# Patient Record
Sex: Female | Born: 1955
Health system: Southern US, Community
[De-identification: ages and names within clinical notes are randomized; demographics above are authoritative.]

## PROBLEM LIST (undated history)

## (undated) DIAGNOSIS — I10 Essential (primary) hypertension: Secondary | ICD-10-CM

## (undated) DIAGNOSIS — R112 Nausea with vomiting, unspecified: Secondary | ICD-10-CM

## (undated) DIAGNOSIS — E669 Obesity, unspecified: Secondary | ICD-10-CM

## (undated) DIAGNOSIS — I4891 Unspecified atrial fibrillation: Secondary | ICD-10-CM

## (undated) DIAGNOSIS — E785 Hyperlipidemia, unspecified: Secondary | ICD-10-CM

## (undated) DIAGNOSIS — Z9889 Other specified postprocedural states: Secondary | ICD-10-CM

## (undated) HISTORY — DX: Hyperlipidemia, unspecified: E78.5

## (undated) HISTORY — DX: Obesity, unspecified: E66.9

## (undated) HISTORY — DX: Essential (primary) hypertension: I10

## (undated) HISTORY — DX: Unspecified atrial fibrillation: I48.91

---

## 1995-02-19 HISTORY — PX: CHOLECYSTECTOMY: SHX55

## 1995-02-19 HISTORY — PX: TOTAL ABDOMINAL HYSTERECTOMY: SHX209

## 2000-02-01 ENCOUNTER — Encounter: Admission: RE | Admit: 2000-02-01 | Discharge: 2000-02-01 | Payer: Self-pay | Admitting: Obstetrics & Gynecology

## 2000-02-01 ENCOUNTER — Encounter: Payer: Self-pay | Admitting: Obstetrics & Gynecology

## 2000-02-09 ENCOUNTER — Other Ambulatory Visit: Admission: RE | Admit: 2000-02-09 | Discharge: 2000-02-09 | Payer: Self-pay | Admitting: Family Medicine

## 2001-05-12 ENCOUNTER — Other Ambulatory Visit: Admission: RE | Admit: 2001-05-12 | Discharge: 2001-05-12 | Payer: Self-pay | Admitting: Obstetrics & Gynecology

## 2001-09-13 ENCOUNTER — Encounter (INDEPENDENT_AMBULATORY_CARE_PROVIDER_SITE_OTHER): Payer: Self-pay | Admitting: Specialist

## 2001-09-13 ENCOUNTER — Ambulatory Visit (HOSPITAL_COMMUNITY): Admission: RE | Admit: 2001-09-13 | Discharge: 2001-09-13 | Payer: Self-pay | Admitting: Gastroenterology

## 2002-06-06 ENCOUNTER — Other Ambulatory Visit: Admission: RE | Admit: 2002-06-06 | Discharge: 2002-06-06 | Payer: Self-pay | Admitting: Obstetrics & Gynecology

## 2003-06-21 ENCOUNTER — Other Ambulatory Visit: Admission: RE | Admit: 2003-06-21 | Discharge: 2003-06-21 | Payer: Self-pay | Admitting: Obstetrics & Gynecology

## 2004-07-07 ENCOUNTER — Other Ambulatory Visit: Admission: RE | Admit: 2004-07-07 | Discharge: 2004-07-07 | Payer: Self-pay | Admitting: Obstetrics & Gynecology

## 2004-09-25 ENCOUNTER — Encounter: Admission: RE | Admit: 2004-09-25 | Discharge: 2004-09-25 | Payer: Self-pay | Admitting: Emergency Medicine

## 2007-08-03 ENCOUNTER — Encounter: Admission: RE | Admit: 2007-08-03 | Discharge: 2007-08-03 | Payer: Self-pay | Admitting: Cardiology

## 2007-08-15 ENCOUNTER — Ambulatory Visit (HOSPITAL_COMMUNITY): Admission: RE | Admit: 2007-08-15 | Discharge: 2007-08-15 | Payer: Self-pay | Admitting: Cardiology

## 2007-08-15 ENCOUNTER — Encounter (INDEPENDENT_AMBULATORY_CARE_PROVIDER_SITE_OTHER): Payer: Self-pay | Admitting: Cardiology

## 2007-12-04 ENCOUNTER — Inpatient Hospital Stay (HOSPITAL_COMMUNITY): Admission: AD | Admit: 2007-12-04 | Discharge: 2007-12-07 | Payer: Self-pay | Admitting: Cardiology

## 2008-08-02 ENCOUNTER — Encounter: Admission: RE | Admit: 2008-08-02 | Discharge: 2008-08-02 | Payer: Self-pay | Admitting: Emergency Medicine

## 2010-03-26 ENCOUNTER — Ambulatory Visit
Admission: RE | Admit: 2010-03-26 | Discharge: 2010-03-26 | Disposition: A | Discharge status: Home / Self Care | Payer: 59 | Source: Ambulatory Visit | Attending: Emergency Medicine | Admitting: Emergency Medicine

## 2010-03-26 ENCOUNTER — Other Ambulatory Visit: Payer: Self-pay | Admitting: Emergency Medicine

## 2010-03-26 DIAGNOSIS — M25569 Pain in unspecified knee: Secondary | ICD-10-CM

## 2010-04-29 ENCOUNTER — Ambulatory Visit (HOSPITAL_BASED_OUTPATIENT_CLINIC_OR_DEPARTMENT_OTHER): Payer: 59 | Attending: Cardiology

## 2010-04-29 DIAGNOSIS — I4892 Unspecified atrial flutter: Secondary | ICD-10-CM | POA: Insufficient documentation

## 2010-04-29 DIAGNOSIS — G4733 Obstructive sleep apnea (adult) (pediatric): Secondary | ICD-10-CM | POA: Insufficient documentation

## 2010-04-29 DIAGNOSIS — R0609 Other forms of dyspnea: Secondary | ICD-10-CM | POA: Insufficient documentation

## 2010-04-29 DIAGNOSIS — R259 Unspecified abnormal involuntary movements: Secondary | ICD-10-CM | POA: Insufficient documentation

## 2010-04-29 DIAGNOSIS — I4891 Unspecified atrial fibrillation: Secondary | ICD-10-CM | POA: Insufficient documentation

## 2010-04-29 DIAGNOSIS — I498 Other specified cardiac arrhythmias: Secondary | ICD-10-CM | POA: Insufficient documentation

## 2010-04-29 DIAGNOSIS — R0989 Other specified symptoms and signs involving the circulatory and respiratory systems: Secondary | ICD-10-CM | POA: Insufficient documentation

## 2010-05-03 DIAGNOSIS — R0989 Other specified symptoms and signs involving the circulatory and respiratory systems: Secondary | ICD-10-CM

## 2010-05-03 DIAGNOSIS — G4733 Obstructive sleep apnea (adult) (pediatric): Secondary | ICD-10-CM

## 2010-05-03 DIAGNOSIS — R259 Unspecified abnormal involuntary movements: Secondary | ICD-10-CM

## 2010-05-03 DIAGNOSIS — I4891 Unspecified atrial fibrillation: Secondary | ICD-10-CM

## 2010-05-03 DIAGNOSIS — R0609 Other forms of dyspnea: Secondary | ICD-10-CM

## 2010-05-03 NOTE — Procedures (Signed)
Christina Mason, Christina Mason                ACCOUNT NO.:  [id number]  MEDICAL RECORD NO.:  [id number]          PATIENT TYPE:  OUT  LOCATION:  SLEEP CENTER                 FACILITY:  [facility]  PHYSICIAN:  Clinton D. Maple Hudson, MD, FCCP, FACPDATE OF BIRTH:  October 25, 1955  DATE OF STUDY:  04/29/2010                           NOCTURNAL POLYSOMNOGRAM  REFERRING PHYSICIAN:  W SPENCER TILLEY JR  INDICATION FOR STUDY:  Hypersomnia with sleep apnea.  EPWORTH SLEEPINESS SCORE:  7/24.  BMI 36.6, weight 220 pounds, height 65 inches, neck 16 inches.  MEDICATIONS:  Home medications are charted and reviewed.  SLEEP ARCHITECTURE:  Total sleep time 218.5 minutes with sleep efficiency 57.9%.  Stage I was 26.8%, stage II 58.4%, stage III absent. REM 14.9% of total sleep time.  Sleep latency 37 minutes.  REM latency 283.5 minutes.  Awake after sleep onset 129.5 minutes.  Arousal index 37.9.  BEDTIME MEDICATIONS:  Vitamin D3, CoQ-10, metoprolol, Micardis, simvastatin, Pradaxa, flecainide.  RESPIRATORY DATA:  Apnea/hypopnea index (AHI) 31.9 per hour.  A total of 116 events was scored including 24 obstructive apneas, 8 central apneas, 1 mixed apnea, and 83 hypopneas.  Events were seen in all sleep positions, especially non-supine.  REM AHI 66.5 per hour, RDI 56 per hour.  Because she was unable to sustain sleep until nearly 3 a.m., she did not meet criteria permitting application of CPAP titration by split protocol on the study night.  OXYGEN DATA:  Very loud snoring with oxygen desaturation to a nadir of 76% and a mean oxygen saturation through the study of 91.7% on room air. During the total recording time including wake and sleep, a total of 42.8 minutes was recorded with oxygen saturation less than 90% and a total of 16.9 minutes was recorded with saturation less than 88% on room air.  CARDIAC DATA:  Most of the study was recorded with the patient in atrial fibrillation/flutter as a ventricular response rate of  70-90 per minute. At about 4 a.m., she converted to sinus bradycardia at the rate of 50-53 beats per minute.  MOVEMENT-PARASOMNIA:  Frequent limb jerks were recorded but could not be shown to affect sleep.  A total of 100 limb jerks were counted but none were associated with arousal or awakening.  IMPRESSIONS-RECOMMENDATIONS: 1. The study night was significant for difficulty initiating and     maintaining sleep.  Sleep was highly fragmented with sustained     intervals of wakefulness until about 3 a.m. when she finally     achieved sustained sleep including some REM.  Medications taken at     bedtime and noted above.  She did not take a sleep aid. 2. Moderately severe obstructive and central sleep apnea/hypopnea     syndrome, AHI 31.9 per hour with events particularly common while     sleeping nonsupine and in REM, RDI 56 per hour.  Very loud snoring     with oxygen desaturation to a nadir of 76% and a mean oxygen     saturation through the study of 91.7% on room air.  During the     total recording time, awake and asleep, a total of 42.8 minutes was  recorded with saturation less than 90% and a total of 16.9 minutes     recorded with saturation less than 88% on room air.  She had     arrived with room air saturation while awake of 95% on room air. 3. Because she was unable to sustain sleep, onset of events was     delayed.  She did not meet the criteria for initiation of the split     protocol CPAP titration for this study.  Consider return for a     dedicated CPAP titration study, with use of a sleep medication     suggested for consolidation and adequate recording. 4. Note that for most of the study, she was in atrial     fibrillation/flutter as noted above, converting to sinus     bradycardia at about 4 a.m.     Clinton D. Maple Hudson, MD, [facility], FACP Diplomate, Biomedical engineer of Sleep Medicine Electronically Signed    CDY/MEDQ  D:  05/03/2010 14:07:07  T:  05/03/2010  22:56:09  Job:  [id number]

## 2010-05-20 ENCOUNTER — Other Ambulatory Visit: Payer: Self-pay | Admitting: Emergency Medicine

## 2010-05-20 ENCOUNTER — Ambulatory Visit
Admission: RE | Admit: 2010-05-20 | Discharge: 2010-05-20 | Disposition: A | Discharge status: Home / Self Care | Payer: 59 | Source: Ambulatory Visit | Attending: Emergency Medicine | Admitting: Emergency Medicine

## 2010-05-20 DIAGNOSIS — J069 Acute upper respiratory infection, unspecified: Secondary | ICD-10-CM

## 2010-06-02 NOTE — Op Note (Signed)
Christina Mason, Christina Mason                ACCOUNT NO.:  [id number]   MEDICAL RECORD NO.:  [id number]          PATIENT TYPE:  INP   LOCATION:  2027                         FACILITY:  [facility]   PHYSICIAN:  Georga Hacking, M.D.DATE OF BIRTH:  1955/03/31   DATE OF PROCEDURE:  12/06/2007  DATE OF DISCHARGE:                               OPERATIVE REPORT   INDICATIONS:  Symptomatic atrial fibrillation.   PROCEDURE:  Elective cardioversion.   PROCEDURE IN DETAIL:  The patient was n.p.o. after midnight.  Dr. Krista Blue  administered 100 mg of propofol intravenously.  Cardioversion was done  using AP patches with 100 watts resulting in reversion to sinus rhythm  after a single shock.  Following cardioversion, her blood pressure was  around 80/60 and she was given intravenous fluids.  She was asymptomatic  at the end of the procedure.   IMPRESSION:  Successful cardioversion of atrial fibrillation.      Georga Hacking, M.D.  Electronically Signed     WST/MEDQ  D:  12/06/2007  T:  12/06/2007  Job:  [id number]   cc:   Reuben Likes, M.D.

## 2010-06-02 NOTE — Discharge Summary (Signed)
Christina Mason, Christina Mason                ACCOUNT NO.:  [id number]   MEDICAL RECORD NO.:  [id number]          PATIENT TYPE:  INP   LOCATION:  2027                         FACILITY:  [facility]   PHYSICIAN:  Georga Hacking, M.D.DATE OF BIRTH:  02-May-1955   DATE OF ADMISSION:  12/04/2007  DATE OF DISCHARGE:  12/07/2007                               DISCHARGE SUMMARY   FINAL DIAGNOSES:  1. Atrial fibrillation of undetermined age of onset successfully      converted to sinus rhythm.      a.     Placed on Multaq this admission.      b.     Successful cardioversion.  2. Hypertensive heart disease.  3. Obesity.  4. Long-term use of anticoagulants.   PROCEDURES:  Cardioversion.   HISTORY:  This is a 55 year old female who was admitted to initiate  dronedarone under monitoring for atrial fibrillation.  She has  previously been in good health with borderline blood pressure for  several years.  She was found to have an irregular heart rate as well as  high blood pressure noted when she went to donate blood.  In Dr.  Melanee Spry office, she was found to be hypertensive, was started on  hydrochlorothiazide and found to have hyperlipidemia.  In addition, she  has a new finding of atrial fibrillation of undetermined age of onset.  She has noticed fatigue and mild dyspnea with exertion over the past  year with a weight gain of 10 pounds over the past 2 years and was  modestly obese.  She was initially seen in July and placed on beta-  blockers which has been titrated up to 100 mg twice a day.  She was  somewhat refractory to becoming therapeutic on warfarin, but she has  been therapeutic now for 6 weeks.  She has had a negative treadmill test  previously and an echocardiogram showed mild left ventricular  hypertrophy.  Previous thyroid tests were normal.  Please see the  previously dictated history and physical for the remainder of the  details.   HOSPITAL COURSE:  The patient was admitted to the  hospital and began on  Multaq 400 mg twice a day.  She remained in atrial fibrillation and  tolerated the Multaq well.  Her warfarin was looked at and her protime  began to elevate and we had to reduce her dose of warfarin somewhat as a  result.  Her laboratory work showed normal CBC, therapeutic INR, and  normal chemistry panel.  EKG showed atrial fibrillation.   On December 06, 2007, she underwent cardioversion with 100 watts  resulting a reverse in a sinus rhythm and tolerated this well.  She had  some mild hypotension following cardioversion resolved with IV fluids  and was stable following this.  Her metoprolol dose was reduced to 50 mg  twice a day.  She had a INR of 3.6 on the day of discharge, was  instructed to hold her warfarin again and on the day of discharge, is to  begin 5 mg on Friday and Saturday with 10 mg on Sunday and  have  followup protime on Monday.   DISCHARGE MEDICATIONS:  1. Warfarin none today, 5 mg on Friday, 5 mg on Saturday, and 10 mg on      Sunday.  She is to have her repeat protime on Monday prior to      further dosing.  2. Vivelle patch 0.1 mg for 24 hours twice weekly.  3. Hydrochlorothiazide 12.5 mg daily.  4. Metoprolol extended release 50 mg twice a day.  5. Prilosec OTC 20 mg daily.  6. Vitamin D 50,000 units international daily.  7. Micardis 80 mg daily.  8. Multaq 400 mg twice daily with food.   DISCHARGE INSTRUCTIONS:  She is instructed to call the office for an  appointment for protime Monday and is to be seen in the office in 1 week  for an office visit and protime.      Georga Hacking, M.D.  Electronically Signed     WST/MEDQ  D:  12/07/2007  T:  12/07/2007  Job:  [id number]   cc:   Reuben Likes, M.D.

## 2010-06-05 NOTE — Op Note (Signed)
   NAME:  Christina Mason, Christina Mason                          ACCOUNT NO.:  [id number]   MEDICAL RECORD NO.:  [id number]                   PATIENT TYPE:  AMB   LOCATION:  ENDO                                 FACILITY:  [facility]   PHYSICIAN:  Charna Elizabeth, M.D.                   DATE OF BIRTH:  09/10/1955   DATE OF PROCEDURE:  09/13/2001  DATE OF DISCHARGE:                                 OPERATIVE REPORT   PROCEDURE:  Colonoscopy.   ENDOSCOPIST:  Charna Elizabeth, M.D.   INSTRUMENT USED:  Olympus video colonoscope.   INDICATIONS FOR PROCEDURE:  Iron deficiency anemia in a 55 year old white  female, rule out colonic polyps, mass and hemorrhoids.   PREPROCEDURE PREPARATION:  Informed consent was procured from the patient.  The patient fasted for eight hours prior to the procedure and prepped with a  bottle of magnesium citrate and a gallon of NuLytely the night prior to the  procedure.   PREPROCEDURE PHYSICAL:  VITAL SIGNS:  The patient had stable vital signs.  NECK:  Neck supple.  LUNGS:  Chest clear to auscultation.  CARDIAC:  S1 and S2, regular.  ABDOMEN:  Abdomen soft with normal bowel sounds.   DESCRIPTION OF PROCEDURE:  The patient was placed in the left lateral  decubitus position, sedated with an additional 2 mg of Versed intravenously.  Once the patient was adequately sedated and had been maintained on low flow  oxygen and continuous cardiac monitoring, the Olympus video colonoscope was  ran from the rectum to the cecum and terminal ileum without difficulty. The  entire exam was normal. No masses, polyps, lesions, ulcerations, or  diverticula were seen.   IMPRESSION:  Normal colonoscopy up to the terminal ileum.   RECOMMENDATIONS:  1. Check a celiac panel.  2. Outpatient follow-up in the next two weeks for further recommendations.                                               Charna Elizabeth, M.D.    JM/MEDQ  D:  09/13/2001  T:  09/17/2001  Job:  [id number]   cc:   Reuben Likes,  M.D.

## 2010-06-05 NOTE — Op Note (Signed)
   NAME:  Christina Mason, Christina Mason                          ACCOUNT NO.:  [id number]   MEDICAL RECORD NO.:  [id number]                   PATIENT TYPE:  AMB   LOCATION:  ENDO                                 FACILITY:  [facility]   PHYSICIAN:  Charna Elizabeth, M.D.                   DATE OF BIRTH:  18-Nov-1955   DATE OF PROCEDURE:  09/13/2001  DATE OF DISCHARGE:                                 OPERATIVE REPORT   PROCEDURE:  Esophagogastroduodenoscopy with multiple biopsies.   ENDOSCOPIST:  Charna Elizabeth, M.D.   INSTRUMENT USED:  Olympus video panendoscope.   INDICATIONS:  A 55 year old white female with a history of iron-deficiency  anemia.  Rule out peptic ulcer disease, esophagitis, gastritis, etc.   PREPROCEDURE PREPARATION:  Informed consent was procured from the patient.  The patient was fasted for eight hours prior to the procedure.   PREPROCEDURE PHYSICAL:  VITAL SIGNS:  The patient had stable vital signs.  NECK:  Supple.  CHEST:  Clear to auscultation.  S1, S2 regular.  ABDOMEN:  Soft with normal bowel sounds.   DESCRIPTION OF PROCEDURE:  The patient was placed in the left lateral  decubitus position and sedated with 50 mg of Demerol and 5 mg of Versed  intravenously.  Once the patient was adequately sedate and maintained on low-  flow oxygen and continuous cardiac monitoring, the Olympus video  panendoscope was advanced through the mouthpiece, over the tongue, into the  esophagus under direct vision.  The proximal esophagus appeared normal.  There was mild distal esophagitis.  A 3-4 cm hiatal hernia was seen on high  retroflexion.  There were multiple small sessile polyps seen in the proximal  half of the stomach.  The rest of the gastric mucosa and main part in antrum  appeared normal.  The proximal small bowel appeared normal as well.   IMPRESSION:  1. Grade 1 distal esophagitis.  2. A 3-4 cm hiatal hernia.  3. Multiple sessile polyps from the proximal stomach, biopsied for  pathology.  4. Normal-appearing antrum and proximal small bowel.   RECOMMENDATIONS:  1. Await pathology results.  2. Avoid all nonsteroidals including aspirin for now.  3.     PPI of choice.  4. Proceed with a colonoscopy at this time.  5. Outpatient follow-up in the next two weeks.                                               Charna Elizabeth, M.D.    JM/MEDQ  D:  09/13/2001  T:  09/17/2001  Job:  [id number]   cc:   Reuben Likes, M.D.

## 2010-06-25 ENCOUNTER — Ambulatory Visit
Admission: RE | Admit: 2010-06-25 | Discharge: 2010-06-25 | Disposition: A | Discharge status: Home / Self Care | Payer: 59 | Source: Ambulatory Visit | Attending: Emergency Medicine | Admitting: Emergency Medicine

## 2010-06-25 ENCOUNTER — Other Ambulatory Visit: Payer: Self-pay | Admitting: Emergency Medicine

## 2010-06-25 DIAGNOSIS — R0602 Shortness of breath: Secondary | ICD-10-CM

## 2010-06-25 DIAGNOSIS — R05 Cough: Secondary | ICD-10-CM

## 2010-06-25 DIAGNOSIS — Z09 Encounter for follow-up examination after completed treatment for conditions other than malignant neoplasm: Secondary | ICD-10-CM

## 2010-10-07 ENCOUNTER — Institutional Professional Consult (permissible substitution): Payer: 59 | Admitting: Pulmonary Disease

## 2010-10-13 ENCOUNTER — Encounter: Payer: Self-pay | Admitting: Pulmonary Disease

## 2010-10-14 ENCOUNTER — Ambulatory Visit (INDEPENDENT_AMBULATORY_CARE_PROVIDER_SITE_OTHER): Payer: 59 | Admitting: Pulmonary Disease

## 2010-10-14 ENCOUNTER — Encounter: Payer: Self-pay | Admitting: Pulmonary Disease

## 2010-10-14 VITALS — BP 124/70 | HR 74 | Temp 98.3°F | Ht 64.5 in | Wt 225.2 lb

## 2010-10-14 DIAGNOSIS — G4733 Obstructive sleep apnea (adult) (pediatric): Secondary | ICD-10-CM

## 2010-10-14 HISTORY — DX: Obstructive sleep apnea (adult) (pediatric): G47.33

## 2010-10-14 NOTE — Progress Notes (Signed)
  Subjective:    Patient ID: Christina Mason, female    DOB: 1955/02/28, 55 y.o.   MRN: [id number]  HPI The patient is a 55 year old female who I've been asked to see for management of obstructive sleep apnea.  She was diagnosed in April of this year with moderate OSA, with AHI of 32 events per hour.  The patient currently has been noted to have loud snoring, but no one has mentioned an abnormal breathing pattern during sleep.  She has moderate numbers of awakenings at night, and only feels rested in the a.m. about 50% of the time.  She notes definite sleep pressure during the day with periods of inactivity, and can have sleepiness with meetings as well.  She has no issues with sleepiness in the evening, and feels that she gets her "second wind".  She has some sleep pressure with driving longer distances.  The patient notes that her weight is up about 18 pounds over the last 2 years, and her Epworth score at the time of her sleep study was 7.  Sleep Questionnaire: What time do you typically go to bed?( Between what hours) 11:30 pm to 12:30 am How long does it take you to fall asleep? about 20 mins How many times during the night do you wake up? 3 What time do you get out of bed to start your day? 0715 Do you drive or operate heavy machinery in your occupation? No How much has your weight changed (up or down) over the past two years? (In pounds) 15 lb (6.804 kg) Have you ever had a sleep study before? Yes If yes, location of study? [facility] If yes, date of study? April 2012 Do you currently use CPAP? No Do you wear oxygen at any time? No     Review of Systems  Constitutional: Negative for fever and unexpected weight change.  HENT: Negative for ear pain, nosebleeds, congestion, sore throat, rhinorrhea, sneezing, trouble swallowing, dental problem, postnasal drip and sinus pressure.   Eyes: Negative for redness and itching.  Respiratory: Positive for shortness of breath. Negative for cough, chest tightness and  wheezing.   Cardiovascular: Positive for palpitations and leg swelling.  Gastrointestinal: Negative for nausea and vomiting.  Genitourinary: Negative for dysuria.  Musculoskeletal: Positive for joint swelling.  Skin: Negative for rash.  Neurological: Negative for headaches.  Hematological: Does not bruise/bleed easily.  Psychiatric/Behavioral: Negative for dysphoric mood. The patient is not nervous/anxious.        Objective:   Physical Exam Constitutional: obese female, no acute distress  HENT:  Nares patent without discharge  Oropharynx without exudate, palate and uvula are moderately elongated  Eyes:  Perrla, eomi, no scleral icterus  Neck:  No JVD, no TMG  Cardiovascular:  Irregular rhythm but CVR, no rubs or gallops.  No murmurs        Intact distal pulses  Pulmonary :  Normal breath sounds, no stridor or respiratory distress   No rales, rhonchi, or wheezing  Abdominal:  Soft, nondistended, bowel sounds present.  No tenderness noted.   Musculoskeletal:  1+ lower extremity edema noted.  Lymph Nodes:  No cervical lymphadenopathy noted  Skin:  No cyanosis noted  Neurologic:  Alert, appropriate, moves all 4 extremities without obvious deficit.         Assessment & Plan:

## 2010-10-14 NOTE — Patient Instructions (Signed)
Will set up on cpap.  Please call if having tolerance issues. Work on weight loss.  followup with me in 5 weeks.

## 2010-10-14 NOTE — Assessment & Plan Note (Signed)
The patient has moderate obstructive sleep apnea by her recent sleep study, and definitely has an impact on her sleep and quality of life.  I've explained to her that it also may impact her cardiovascular health, especially in light of her atrial fibrillation.  I have reviewed the various treatment options for sleep apnea, but have recommended CPAP while working on weight reduction.  She is willing to give this a try. I will set the patient up on cpap at a moderate pressure level to allow for desensitization, and will troubleshoot the device over the next 4-6weeks if needed.  The pt is to call me if having issues with tolerance.  Will then optimize the pressure once patient is able to wear cpap on a consistent basis.

## 2010-10-19 ENCOUNTER — Encounter: Payer: Self-pay | Admitting: Internal Medicine

## 2010-10-20 LAB — COMPREHENSIVE METABOLIC PANEL
AST: 28
Albumin: 3.7
Alkaline Phosphatase: 72
BUN: 18
CO2: 27
Chloride: 106
GFR calc Af Amer: >60
Potassium: 4
Total Bilirubin: 0.6

## 2010-10-20 LAB — DIFFERENTIAL
Basophils Absolute: 0
Basophils Relative: 1
Eosinophils Relative: 2
Monocytes Absolute: 0.5

## 2010-10-20 LAB — PROTIME-INR
INR: 3.6 — ABNORMAL HIGH
INR: 3.6 — ABNORMAL HIGH
Prothrombin Time: 38.7 — ABNORMAL HIGH

## 2010-10-20 LAB — CBC
HCT: 44.9
Platelets: 256
RBC: 4.78
WBC: 7.5

## 2011-05-06 ENCOUNTER — Other Ambulatory Visit: Payer: Self-pay | Admitting: Dermatology

## 2011-05-06 DIAGNOSIS — G629 Polyneuropathy, unspecified: Secondary | ICD-10-CM | POA: Insufficient documentation

## 2011-05-06 DIAGNOSIS — N959 Unspecified menopausal and perimenopausal disorder: Secondary | ICD-10-CM

## 2011-05-06 DIAGNOSIS — I10 Essential (primary) hypertension: Secondary | ICD-10-CM

## 2011-05-06 HISTORY — DX: Unspecified menopausal and perimenopausal disorder: N95.9

## 2011-05-06 HISTORY — DX: Essential (primary) hypertension: I10

## 2011-06-05 ENCOUNTER — Other Ambulatory Visit: Payer: Self-pay | Admitting: Cardiology

## 2013-06-08 ENCOUNTER — Ambulatory Visit (INDEPENDENT_AMBULATORY_CARE_PROVIDER_SITE_OTHER): Payer: 59 | Admitting: General Surgery

## 2013-06-08 ENCOUNTER — Encounter (INDEPENDENT_AMBULATORY_CARE_PROVIDER_SITE_OTHER): Payer: Self-pay | Admitting: General Surgery

## 2013-06-08 VITALS — BP 128/80 | HR 103 | Temp 97.7°F | Ht 64.0 in | Wt 227.8 lb

## 2013-06-08 DIAGNOSIS — D171 Benign lipomatous neoplasm of skin and subcutaneous tissue of trunk: Secondary | ICD-10-CM

## 2013-06-08 DIAGNOSIS — D1779 Benign lipomatous neoplasm of other sites: Secondary | ICD-10-CM

## 2013-06-08 NOTE — Progress Notes (Signed)
Patient ID: Christina Mason, female   DOB: 14-Dec-1955, 58 y.o.   MRN: [id number]  Chief Complaint  Patient presents with  . lipoma on back    HPI Christina Mason is a 58 y.o. female.  Referred by Roe Coombs, PA HPI This is a 58 year old female who for several years it has a mass on her back. This is not really bother her much at all. It is a little bit tender to the touch when she pulls on it. She's otherwise had no problems with this. Not really any period of rapid growth either. She was seen  by her primary care physician and physician's assistant. This was thought to be a lipoma and it appeared that was the case. She is referred for evaluation.  Past Medical History  Diagnosis Date  . Atrial fibrillation   . Hypertension   . Hyperlipidemia   . Obesity     Past Surgical History  Procedure Laterality Date  . Cholecystectomy  feb 1997  . Total abdominal hysterectomy  feb 1997    Family History  Problem Relation Age of Onset  . Asthma Mother   . Heart disease Mother   . Rheum arthritis Mother     Social History History  Substance Use Topics  . Smoking status: Never Smoker   . Smokeless tobacco: Not on file  . Alcohol Use: Yes    Allergies  Allergen Reactions  . Lisinopril     cough    Current Outpatient Prescriptions  Medication Sig Dispense Refill  . Cholecalciferol (VITAMIN D3) 2000 UNITS TABS Take 1 tablet by mouth daily.        . Coenzyme Q10 (COQ10) 200 MG CAPS Take 1 capsule by mouth daily.        . dabigatran (PRADAXA) 150 MG CAPS Take 150 mg by mouth 2 (two) times daily.        Marland Kitchen estradiol (VIVELLE-DOT) 0.1 MG/24HR Place 1 patch onto the skin as directed.       . fish oil-omega-3 fatty acids 1000 MG capsule Take 1 capsule by mouth daily.        . flecainide (TAMBOCOR) 100 MG tablet Take 100 mg by mouth 2 (two) times daily.        . hydrochlorothiazide (MICROZIDE) 12.5 MG capsule Take 25 mg by mouth daily.        . metoprolol (TOPROL-XL) 100 MG 24 hr  tablet Take 50 mg by mouth 2 (two) times daily.        . Multiple Vitamin (MULTIVITAMIN) capsule Take 1 capsule by mouth daily.        Marland Kitchen omeprazole (PRILOSEC) 20 MG capsule Take 20 mg by mouth daily.        . simvastatin (ZOCOR) 20 MG tablet Take 20 mg by mouth daily.        Marland Kitchen telmisartan (MICARDIS) 80 MG tablet Take 80 mg by mouth daily.         No current facility-administered medications for this visit.    Review of Systems Review of Systems  Constitutional: Negative for fever, chills and unexpected weight change.  HENT: Negative for congestion, hearing loss, sore throat, trouble swallowing and voice change.   Eyes: Negative for visual disturbance.  Respiratory: Negative for cough and wheezing.   Cardiovascular: Negative for chest pain, palpitations and leg swelling.  Gastrointestinal: Negative for nausea, vomiting, abdominal pain, diarrhea, constipation, blood in stool, abdominal distention and anal bleeding.  Genitourinary: Negative for hematuria, vaginal bleeding and difficulty  urinating.  Musculoskeletal: Positive for arthralgias.  Skin: Negative for rash and wound.  Neurological: Negative for seizures, syncope and headaches.  Hematological: Negative for adenopathy. Does not bruise/bleed easily.  Psychiatric/Behavioral: Negative for confusion.    Blood pressure 128/80, pulse 103, temperature 97.7 F (36.5 C), height 5\' 4"  (1.626 m), weight 227 lb 12.8 oz (103.329 kg).  Physical Exam Physical Exam  Vitals reviewed. Constitutional: She appears well-developed and well-nourished.  Pulmonary/Chest:        Assessment    Back lipoma    Plan    I do think this is a lipoma. I told her that I do not think it necessarily need to be excised. We discussed indications for excision including a change in her exam, period of rapid growth, or symptoms that she would like to have it removed. She is comfortable watching this and I will plan on seeing her back as needed.         Rolm Bookbinder 06/08/2013, 10:08 AM

## 2013-06-08 NOTE — Patient Instructions (Signed)

## 2013-11-08 DIAGNOSIS — K449 Diaphragmatic hernia without obstruction or gangrene: Secondary | ICD-10-CM | POA: Insufficient documentation

## 2013-11-08 HISTORY — DX: Diaphragmatic hernia without obstruction or gangrene: K44.9

## 2014-08-01 ENCOUNTER — Other Ambulatory Visit: Payer: Self-pay | Admitting: Obstetrics & Gynecology

## 2014-08-02 LAB — CYTOLOGY - PAP

## 2015-03-13 ENCOUNTER — Ambulatory Visit (HOSPITAL_COMMUNITY)
Admission: RE | Admit: 2015-03-13 | Discharge: 2015-03-13 | Disposition: A | Discharge status: Home / Self Care | Payer: 59 | Source: Ambulatory Visit | Attending: Vascular Surgery | Admitting: Vascular Surgery

## 2015-03-13 ENCOUNTER — Other Ambulatory Visit: Payer: Self-pay | Admitting: Cardiology

## 2015-03-13 DIAGNOSIS — I10 Essential (primary) hypertension: Secondary | ICD-10-CM | POA: Insufficient documentation

## 2015-03-13 DIAGNOSIS — I872 Venous insufficiency (chronic) (peripheral): Secondary | ICD-10-CM

## 2015-03-13 DIAGNOSIS — I4891 Unspecified atrial fibrillation: Secondary | ICD-10-CM | POA: Diagnosis not present

## 2015-03-13 DIAGNOSIS — E785 Hyperlipidemia, unspecified: Secondary | ICD-10-CM | POA: Diagnosis not present

## 2015-03-13 DIAGNOSIS — E669 Obesity, unspecified: Secondary | ICD-10-CM | POA: Diagnosis not present

## 2017-07-19 DIAGNOSIS — G4733 Obstructive sleep apnea (adult) (pediatric): Secondary | ICD-10-CM | POA: Diagnosis not present

## 2017-10-17 DIAGNOSIS — L821 Other seborrheic keratosis: Secondary | ICD-10-CM | POA: Diagnosis not present

## 2017-10-17 DIAGNOSIS — L918 Other hypertrophic disorders of the skin: Secondary | ICD-10-CM | POA: Diagnosis not present

## 2017-10-19 DIAGNOSIS — H2513 Age-related nuclear cataract, bilateral: Secondary | ICD-10-CM | POA: Diagnosis not present

## 2017-11-09 DIAGNOSIS — G6289 Other specified polyneuropathies: Secondary | ICD-10-CM | POA: Diagnosis not present

## 2017-12-28 DIAGNOSIS — Z01419 Encounter for gynecological examination (general) (routine) without abnormal findings: Secondary | ICD-10-CM | POA: Diagnosis not present

## 2017-12-28 DIAGNOSIS — Z6839 Body mass index (BMI) 39.0-39.9, adult: Secondary | ICD-10-CM | POA: Diagnosis not present

## 2017-12-28 DIAGNOSIS — Z1231 Encounter for screening mammogram for malignant neoplasm of breast: Secondary | ICD-10-CM | POA: Diagnosis not present

## 2018-03-08 DIAGNOSIS — G4733 Obstructive sleep apnea (adult) (pediatric): Secondary | ICD-10-CM | POA: Diagnosis not present

## 2018-03-20 DIAGNOSIS — R202 Paresthesia of skin: Secondary | ICD-10-CM | POA: Diagnosis not present

## 2018-03-28 DIAGNOSIS — D0462 Carcinoma in situ of skin of left upper limb, including shoulder: Secondary | ICD-10-CM | POA: Diagnosis not present

## 2018-03-30 DIAGNOSIS — I10 Essential (primary) hypertension: Secondary | ICD-10-CM | POA: Diagnosis not present

## 2018-03-30 DIAGNOSIS — Z0000 Encounter for general adult medical examination without abnormal findings: Secondary | ICD-10-CM | POA: Diagnosis not present

## 2018-03-30 DIAGNOSIS — I4891 Unspecified atrial fibrillation: Secondary | ICD-10-CM | POA: Diagnosis not present

## 2018-03-30 DIAGNOSIS — E782 Mixed hyperlipidemia: Secondary | ICD-10-CM | POA: Diagnosis not present

## 2018-03-30 HISTORY — DX: Morbid (severe) obesity due to excess calories: E66.01

## 2018-04-05 DIAGNOSIS — C4492 Squamous cell carcinoma of skin, unspecified: Secondary | ICD-10-CM

## 2018-04-05 HISTORY — DX: Squamous cell carcinoma of skin, unspecified: C44.92

## 2018-04-13 ENCOUNTER — Telehealth: Payer: Self-pay | Admitting: Emergency Medicine

## 2018-04-13 NOTE — Telephone Encounter (Signed)
Left message for patient to return call regarding switching visit to a televisit.  

## 2018-04-14 NOTE — Telephone Encounter (Signed)
Patient informed of visit on Monday will be a video visit. Patient understands and agrees to this.

## 2018-04-17 ENCOUNTER — Other Ambulatory Visit: Payer: Self-pay

## 2018-04-17 ENCOUNTER — Telehealth: Payer: Self-pay | Admitting: Emergency Medicine

## 2018-04-17 ENCOUNTER — Telehealth (INDEPENDENT_AMBULATORY_CARE_PROVIDER_SITE_OTHER): Payer: BLUE CROSS/BLUE SHIELD | Admitting: Cardiology

## 2018-04-17 DIAGNOSIS — I10 Essential (primary) hypertension: Secondary | ICD-10-CM | POA: Insufficient documentation

## 2018-04-17 DIAGNOSIS — E785 Hyperlipidemia, unspecified: Secondary | ICD-10-CM

## 2018-04-17 DIAGNOSIS — Z7901 Long term (current) use of anticoagulants: Secondary | ICD-10-CM

## 2018-04-17 DIAGNOSIS — I4821 Permanent atrial fibrillation: Secondary | ICD-10-CM

## 2018-04-17 DIAGNOSIS — I4891 Unspecified atrial fibrillation: Secondary | ICD-10-CM | POA: Diagnosis not present

## 2018-04-17 DIAGNOSIS — G4733 Obstructive sleep apnea (adult) (pediatric): Secondary | ICD-10-CM | POA: Diagnosis not present

## 2018-04-17 HISTORY — DX: Long term (current) use of anticoagulants: Z79.01

## 2018-04-17 HISTORY — DX: Hyperlipidemia, unspecified: E78.5

## 2018-04-17 NOTE — Patient Instructions (Addendum)
Medication Instructions:  Your physician recommends that you continue on your current medications as directed. Please refer to the Current Medication list given to you today.cur  If you need a refill on your cardiac medications before your next appointment, please call your pharmacy.   Lab work: None.  If you have labs (blood work) drawn today and your tests are completely normal, you will receive your results only by: Marland Kitchen MyChart Message (if you have MyChart) OR . A paper copy in the mail If you have any lab test that is abnormal or we need to change your treatment, we will call you to review the results.  Testing/Procedures: Your physician has requested that you have an echocardiogram. Echocardiography is a painless test that uses sound waves to create images of your heart. It provides your doctor with information about the size and shape of your heart and how well your heart's chambers and valves are working. This procedure takes approximately one hour. There are no restrictions for this procedure.    Follow-Up: At [facility], you and your health needs are our priority.  As part of our continuing mission to provide you with exceptional heart care, we have created designated Provider Care Teams.  These Care Teams include your primary Cardiologist (physician) and Advanced Practice Providers (APPs -  Physician Assistants and Nurse Practitioners) who all work together to provide you with the care you need, when you need it. You will need a follow up appointment in 5 months.  Please call our office 2 months in advance to schedule this appointment.  You may see No primary care provider on file. or another member of our [employer] Provider Team in [place]: Shirlee More, MD . Jyl Heinz, MD  Any Other Special Instructions Will Be Listed Below (If Applicable).     Echocardiogram An echocardiogram is a procedure that uses painless sound waves (ultrasound) to produce an image of  the heart. Images from an echocardiogram can provide important information about:  Signs of coronary artery disease (CAD).  Aneurysm detection. An aneurysm is a weak or damaged part of an artery wall that bulges out from the normal force of blood pumping through the body.  Heart size and shape. Changes in the size or shape of the heart can be associated with certain conditions, including heart failure, aneurysm, and CAD.  Heart muscle function.  Heart valve function.  Signs of a past heart attack.  Fluid buildup around the heart.  Thickening of the heart muscle.  A tumor or infectious growth around the heart valves. Tell a health care provider about:  Any allergies you have.  All medicines you are taking, including vitamins, herbs, eye drops, creams, and over-the-counter medicines.  Any blood disorders you have.  Any surgeries you have had.  Any medical conditions you have.  Whether you are pregnant or may be pregnant. What are the risks? Generally, this is a safe procedure. However, problems may occur, including:  Allergic reaction to dye (contrast) that may be used during the procedure. What happens before the procedure? No specific preparation is needed. You may eat and drink normally. What happens during the procedure?   An IV tube may be inserted into one of your veins.  You may receive contrast through this tube. A contrast is an injection that improves the quality of the pictures from your heart.  A gel will be applied to your chest.  A wand-like tool (transducer) will be moved over your chest. The gel  will help to transmit the sound waves from the transducer.  The sound waves will harmlessly bounce off of your heart to allow the heart images to be captured in real-time motion. The images will be recorded on a computer. The procedure may vary among health care providers and hospitals. What happens after the procedure?  You may return to your normal,  everyday life, including diet, activities, and medicines, unless your health care provider tells you not to do that. Summary  An echocardiogram is a procedure that uses painless sound waves (ultrasound) to produce an image of the heart.  Images from an echocardiogram can provide important information about the size and shape of your heart, heart muscle function, heart valve function, and fluid buildup around your heart.  You do not need to do anything to prepare before this procedure. You may eat and drink normally.  After the echocardiogram is completed, you may return to your normal, everyday life, unless your health care provider tells you not to do that. This information is not intended to replace advice given to you by your health care provider. Make sure you discuss any questions you have with your health care provider. Document Released: 01/02/2000 Document Revised: 02/07/2016 Document Reviewed: 02/07/2016 Elsevier Interactive Patient Education  2019 [employer].

## 2018-04-17 NOTE — Telephone Encounter (Signed)
Patient needs echocardiogram scheduled when able.  

## 2018-04-17 NOTE — Addendum Note (Signed)
Addended by: Ashok Norris on: 04/17/2018 04:22 PM   Modules accepted: Orders

## 2018-04-17 NOTE — Progress Notes (Signed)
Virtual Visit via Video Note    Evaluation Performed:  Follow-up visit  This visit type was conducted due to national recommendations for restrictions regarding the COVID-19 Pandemic (e.g. social distancing).  This format is felt to be most appropriate for this patient at this time.  All issues noted in this document were discussed and addressed.  No physical exam was performed (except for noted visual exam findings with Video Visits).  Please refer to the patient's chart (MyChart message for video visits and phone note for telephone visits) for the patient's consent to telehealth for [facility].  Date:  04/17/2018  ID: Christina HENRIE, DOB 1955/07/22, MRN [id number]   Patient Location:  [address] [place]  [address]   Provider location:   [place] [facility]  PCP:  Chesley Noon, MD  Cardiologist:  Jenne Campus, MD     Chief Complaint: I am doing well  History of Present Illness:    Christina Mason is a 63 y.o. female  who presents via audio/video conferencing for a telehealth visit today.  She is a patient of Dr. Thurman Coyer.  She does have past medical history significant for permanent atrial fibrillation, hypertension, dyslipidemia, chronic venous insufficiency, long-term anticoagulant use.  We talked over the dealing today overall she says she is doing well she described to have some exertional shortness of breath as usual.  Sometimes rarely she had some palpitations that she is aware of her heart beating somewhat irregular and fast.  She does have cardiac device but she was not sure exactly how to send the recording to [place] I gave her some instruction how to do it.  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  We discussed [place] [address] issue with all precautions that she need to take.   The patient does not symptoms concerning for COVID-19 infection (fever, chills, cough, or new SHORTNESS OF BREATH).    Prior CV studies:   The  following studies were reviewed today:       Past Medical History:  Diagnosis Date  . Atrial fibrillation ([place])   . Hyperlipidemia   . Hypertension   . Obesity     Past Surgical History:  Procedure Laterality Date  . CHOLECYSTECTOMY  feb 1997  . TOTAL ABDOMINAL HYSTERECTOMY  feb 1997     No outpatient medications have been marked as taking for the 04/17/18 encounter (Appointment) with Park Liter, MD.      Family History: The patient's family history includes Alcoholism in her father; Asthma in her mother; CAD in her mother; Dementia in her mother; Heart disease in her mother; Hyperlipidemia in her sister; Hypertension in her brother and mother; Rheum arthritis in her mother; Stroke in her mother.   ROS:   Please see the history of present illness.     All other systems reviewed and are negative.   Labs/Other Tests and Data Reviewed:     Recent Labs: No results found for requested labs within last 8760 hours.  Recent Lipid Panel No results found for: CHOL, TRIG, HDL, CHOLHDL, VLDL, LDLCALC, LDLDIRECT    Exam:    Vital Signs:  Wt 141 lb (64 kg)   BMI 24.20 kg/m    Well nourished, well developed female in no acute distress. She is actually quite cheerful and I saw her walking since she relocated to a different more quiet room and she has no difficulty walking.  Described to have only minimal  swelling of lower extremities.  Diagnosis for this visit:   1. Obstructive sleep apnea   2. Permanent atrial fibrillation   3. Essential hypertension   4. Long term (current) use of anticoagulants   5. Dyslipidemia      ASSESSMENT & PLAN:    1.  Permanent atrial fibrillation rate appears to be controlled.  She is anticoagulated which I will continue her chads 2 vascular equals 3.  In the future she will require to have an echocardiogram to assess left ventricular ejection fraction as well as left atrial size but for now we will wait until the situation with  coronavirus will become more stable. 2.  Obstructive sleep apnea and that problem has been addressed by primary care physicians. 3.  Essential hypertension blood pressure appears to be very well controlled continue present management. 4.  Long-term anticoagulant use.  She understand exactly what she takes anticoagulation will continue. 5.  Dyslipidemia she takes only fish oi as well as simvastatin, l will try to check her fasting lipid profile.  COVID-19 Education: The signs and symptoms of COVID-19 were discussed with the patient and how to seek care for testing (follow up with PCP or arrange E-visit).  The importance of social distancing was discussed today.  Patient Risk:   After full review of this patients clinical status, I feel that they are at least moderate risk at this time.  Time:   Today, I have spent 17 minutes with the patient with telehealth technology discussing pt health issues.     Medication Adjustments/Labs and Tests Ordered: Current medicines are reviewed at length with the patient today.  Concerns regarding medicines are outlined above.  No orders of the defined types were placed in this encounter.  Medication changes: No orders of the defined types were placed in this encounter.    Disposition: Follow-up with the video link in 5 months, she will require echocardiogram acuity level 3  Signed, Park Liter, MD, [facility] 04/17/2018 3:23 PM    [facility]

## 2018-05-02 NOTE — Telephone Encounter (Signed)
A recall has been placed for the patient's echo

## 2018-06-22 DIAGNOSIS — G629 Polyneuropathy, unspecified: Secondary | ICD-10-CM | POA: Diagnosis not present

## 2018-06-22 DIAGNOSIS — I10 Essential (primary) hypertension: Secondary | ICD-10-CM | POA: Diagnosis not present

## 2018-06-24 ENCOUNTER — Other Ambulatory Visit: Payer: Self-pay | Admitting: Cardiology

## 2018-08-11 ENCOUNTER — Other Ambulatory Visit: Payer: Self-pay

## 2018-08-11 ENCOUNTER — Ambulatory Visit (HOSPITAL_COMMUNITY)
Admission: RE | Admit: 2018-08-11 | Discharge: 2018-08-11 | Disposition: A | Discharge status: Home / Self Care | Payer: BC Managed Care – PPO | Source: Ambulatory Visit | Attending: Cardiology | Admitting: Cardiology

## 2018-08-11 DIAGNOSIS — I10 Essential (primary) hypertension: Secondary | ICD-10-CM | POA: Insufficient documentation

## 2018-08-11 DIAGNOSIS — I4891 Unspecified atrial fibrillation: Secondary | ICD-10-CM | POA: Insufficient documentation

## 2018-08-11 DIAGNOSIS — E785 Hyperlipidemia, unspecified: Secondary | ICD-10-CM | POA: Diagnosis not present

## 2018-08-11 NOTE — Progress Notes (Signed)
  Echocardiogram 2D Echocardiogram has been performed.  Christina Mason 08/11/2018, 11:06 AM

## 2018-09-06 ENCOUNTER — Encounter: Payer: Self-pay | Admitting: Cardiology

## 2018-09-06 ENCOUNTER — Other Ambulatory Visit: Payer: Self-pay

## 2018-09-06 ENCOUNTER — Ambulatory Visit (INDEPENDENT_AMBULATORY_CARE_PROVIDER_SITE_OTHER): Payer: BC Managed Care – PPO | Admitting: Cardiology

## 2018-09-06 VITALS — BP 110/66 | HR 90 | Ht 64.0 in | Wt 237.0 lb

## 2018-09-06 DIAGNOSIS — I10 Essential (primary) hypertension: Secondary | ICD-10-CM

## 2018-09-06 DIAGNOSIS — M7989 Other specified soft tissue disorders: Secondary | ICD-10-CM | POA: Diagnosis not present

## 2018-09-06 DIAGNOSIS — G4733 Obstructive sleep apnea (adult) (pediatric): Secondary | ICD-10-CM

## 2018-09-06 DIAGNOSIS — I4821 Permanent atrial fibrillation: Secondary | ICD-10-CM

## 2018-09-06 DIAGNOSIS — E785 Hyperlipidemia, unspecified: Secondary | ICD-10-CM

## 2018-09-06 DIAGNOSIS — Z7901 Long term (current) use of anticoagulants: Secondary | ICD-10-CM

## 2018-09-06 HISTORY — DX: Other specified soft tissue disorders: M79.89

## 2018-09-06 NOTE — Patient Instructions (Addendum)
Medication Instructions:  Your physician recommends that you continue on your current medications as directed. Please refer to the Current Medication list given to you today.  If you need a refill on your cardiac medications before your next appointment, please call your pharmacy.   Lab work: Your physician recommends that you return for lab work:  BMP PRO BNP  If you have labs (blood work) drawn today and your tests are completely normal, you will receive your results only by: Marland Kitchen MyChart Message (if you have MyChart) OR . A paper copy in the mail If you have any lab test that is abnormal or we need to change your treatment, we will call you to review the results.  Testing/Procedures: NONE  Follow-Up: At [facility], you and your health needs are our priority.  As part of our continuing mission to provide you with exceptional heart care, we have created designated Provider Care Teams.  These Care Teams include your primary Cardiologist (physician) and Advanced Practice Providers (APPs -  Physician Assistants and Nurse Practitioners) who all work together to provide you with the care you need, when you need it. You will need a follow up appointment in 2 months.  Please call our office 2 months in advance to schedule this appointment.  You may see No primary care provider on file. or another member of our [employer] Provider Team in [place]: Shirlee More, MD . Jyl Heinz, MD  Any Other Special Instructions Will Be Listed Below (If Applicable).  Dr. Agustin Cree has referred you to see a sleep specialist. If their office has not called you in 1 week, please call our office.

## 2018-09-06 NOTE — Progress Notes (Signed)
Cardiology Office Note:    Date:  09/06/2018   ID:  Christina Mason, DOB 1955/06/15, MRN [id number]  PCP:  Chesley Noon, MD  Cardiologist:  Jenne Campus, MD    Referring MD: Chesley Noon, MD   Chief Complaint  Patient presents with  . Follow-up  Doing well  History of Present Illness:    Christina Mason is a 63 y.o. female with permanent atrial fibrillation, hypertension, hyperlipidemia comes today to my office for follow-up.  Overall she is doing well.  She does have chronic swelling of lower extremities that been going on for years.  She does take hydrochlorothiazide with a very limited response.  Described to have palpitations very rarely she does have a cardiac device that allowed her to record EKG.  Overall seems to be doing well.  Past Medical History:  Diagnosis Date  . Atrial fibrillation ([place])   . Hyperlipidemia   . Hypertension   . Obesity     Past Surgical History:  Procedure Laterality Date  . CHOLECYSTECTOMY  feb 1997  . TOTAL ABDOMINAL HYSTERECTOMY  feb 1997    Current Medications: Current Meds  Medication Sig  . cholecalciferol (VITAMIN D) 25 MCG (1000 UT) tablet Take 2 tablets by mouth daily.   . Coenzyme Q10 (COQ10) 400 MG CAPS Take 1 capsule by mouth daily.  . DULoxetine (CYMBALTA) 60 MG capsule Take 1 capsule by mouth daily.  Marland Kitchen ELIQUIS 5 MG TABS tablet TAKE 1 TABLET BY MOUTH TWICE A DAY  . fish oil-omega-3 fatty acids 1000 MG capsule Take 1 capsule by mouth daily.    . hydrochlorothiazide (HYDRODIURIL) 25 MG tablet TAKE 1 TABLET BY MOUTH EVERY DAY  . metoprolol succinate (TOPROL-XL) 100 MG 24 hr tablet TAKE 1 TABLET IN THE MORNING AND 1/2 TABLET IN THE EVENING  . Multiple Vitamin (MULTIVITAMIN) capsule Take 1 capsule by mouth daily.    Marland Kitchen omeprazole (PRILOSEC) 20 MG capsule Take 20 mg by mouth daily.    Marland Kitchen PREMARIN 0.625 MG tablet Take 0.625 mg by mouth daily.  . simvastatin (ZOCOR) 20 MG tablet Take 20 mg by mouth daily.    Marland Kitchen telmisartan  (MICARDIS) 80 MG tablet TAKE 1 TABLET BY MOUTH EVERY DAY  . Turmeric Curcumin 500 MG CAPS Take 1 capsule by mouth 2 (two) times daily.     Allergies:   Lisinopril   Social History   Socioeconomic History  . Marital status: Married    Spouse name: Renato  . Number of children: Y  . Years of education: Not on file  . Highest education level: Not on file  Occupational History  . 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 Financial resource strain: Not on file  . Food insecurity    Worry: Not on file    Inability: Not on file  . Transportation needs    Medical: Not on file    Non-medical: Not on file  Tobacco Use  . Smoking status: Never Smoker  . Smokeless tobacco: Never Used  Substance and Sexual Activity  . Alcohol use: Yes    Comment: social  . Drug use: No  . Sexual activity: Not on file  Lifestyle  . Physical activity    Days per week: Not on file    Minutes per session: Not on file  . Stress: Not on file  Relationships  . Social Herbalist on phone: Not on file    Gets together: Not on file  Attends religious service: Not on file    Active member of club or organization: Not on file    Attends meetings of clubs or organizations: Not on file    Relationship status: Not on file  Other Topics Concern  . Not on file  Social History Narrative  . Not on file     Family History: The patient's family history includes Alcoholism in her father; Asthma in her mother; CAD in her mother; Dementia in her mother; Heart disease in her mother; Hyperlipidemia in her sister; Hypertension in her brother and mother; Rheum arthritis in her mother; Stroke in her mother. ROS:   Please see the history of present illness.    All 14 point review of systems negative except as described per history of present illness  EKGs/Labs/Other Studies Reviewed:      Recent Labs: No results found for requested labs within last 8760 hours.  Recent Lipid Panel No results  found for: CHOL, TRIG, HDL, CHOLHDL, VLDL, LDLCALC, LDLDIRECT  Physical Exam:    VS:  BP 110/66   Pulse 90   Ht 5\' 4"  (1.626 m)   Wt 237 lb (107.5 kg)   SpO2 96%   BMI 40.68 kg/m     Wt Readings from Last 3 Encounters:  09/06/18 237 lb (107.5 kg)  06/08/13 227 lb 12.8 oz (103.3 kg)  10/14/10 225 lb 3.2 oz (102.2 kg)     GEN:  Well nourished, well developed in no acute distress HEENT: Normal NECK: No JVD; No carotid bruits LYMPHATICS: No lymphadenopathy CARDIAC: Irregularly irregular, no murmurs, no rubs, no gallops RESPIRATORY:  Clear to auscultation without rales, wheezing or rhonchi  ABDOMEN: Soft, non-tender, non-distended MUSCULOSKELETAL:  No edema; No deformity  SKIN: Warm and dry LOWER EXTREMITIES: no swelling NEUROLOGIC:  Alert and oriented x 3 PSYCHIATRIC:  Normal affect   ASSESSMENT:    1. Permanent atrial fibrillation   2. Essential hypertension   3. Obstructive sleep apnea   4. Long term (current) use of anticoagulants   5. Dyslipidemia   6. Leg swelling    PLAN:    In order of problems listed above:  1. Permanent atrial fibrillation rate appears to be controlled.  Anticoagulated which I will continue. 2. Essential hypertension blood pressure well controlled 3. Obstructive sleep apnea with her swelling of lower extremities I am worried about poor control of this problem.  She did see last time sleep specialist 8 years ago.  I will refer her back to our sleep clinic so she can be appropriately managed. 4. Long-term anticoagulation.  We will continue 5. Dyslipidemia follow-up by internal medicine team 6. Leg swelling Chem-7 will be checked to see if we can switch her to a more effective diuretic.   Medication Adjustments/Labs and Tests Ordered: Current medicines are reviewed at length with the patient today.  Concerns regarding medicines are outlined above.  No orders of the defined types were placed in this encounter.  Medication changes: No orders of  the defined types were placed in this encounter.   Signed, Park Liter, MD, [facility] 09/06/2018 [place]

## 2018-09-06 NOTE — Addendum Note (Signed)
Addended by: Polly Cobia A on: 09/06/2018 04:24 PM   Modules accepted: Orders

## 2018-09-07 LAB — BASIC METABOLIC PANEL
BUN/Creatinine Ratio: 18 (ref 12–28)
BUN: 13 mg/dL (ref 8–27)
CO2: 27 mmol/L (ref 20–29)
Calcium: 10.3 mg/dL (ref 8.7–10.3)
Chloride: 100 mmol/L (ref 96–106)
Creatinine, Ser: 0.72 mg/dL (ref 0.57–1.00)
GFR calc Af Amer: 103 mL/min/{1.73_m2} (ref >59)
GFR calc non Af Amer: 89 mL/min/{1.73_m2} (ref >59)
Glucose: 94 mg/dL (ref 65–99)
Potassium: 4 mmol/L (ref 3.5–5.2)
Sodium: 143 mmol/L (ref 134–144)

## 2018-09-07 LAB — PRO B NATRIURETIC PEPTIDE: NT-Pro BNP: 316 pg/mL — ABNORMAL HIGH (ref 0–287)

## 2018-09-08 ENCOUNTER — Telehealth: Payer: Self-pay | Admitting: Emergency Medicine

## 2018-09-08 DIAGNOSIS — I4821 Permanent atrial fibrillation: Secondary | ICD-10-CM

## 2018-09-08 DIAGNOSIS — M7989 Other specified soft tissue disorders: Secondary | ICD-10-CM

## 2018-09-08 MED ORDER — POTASSIUM CHLORIDE ER 10 MEQ PO TBCR: 10.0000 meq | EXTENDED_RELEASE_TABLET | Freq: Every day | ORAL | 1 refills | Status: DC

## 2018-09-08 MED ORDER — FUROSEMIDE 40 MG PO TABS: 40.0000 mg | ORAL_TABLET | Freq: Every day | ORAL | 3 refills | Status: DC

## 2018-09-08 NOTE — Addendum Note (Signed)
Addended by: Polly Cobia A on: 09/08/2018 02:10 PM   Modules accepted: Orders

## 2018-09-08 NOTE — Telephone Encounter (Signed)
Patient returned call, informed to stop HCTZ and start lasix 40mg  QD and kcl 10 meq QD then return in 1 week for Chem 7.

## 2018-09-08 NOTE — Telephone Encounter (Signed)
Left message for patient to return call regarding results  

## 2018-09-12 DIAGNOSIS — K625 Hemorrhage of anus and rectum: Secondary | ICD-10-CM | POA: Diagnosis not present

## 2018-09-12 DIAGNOSIS — R635 Abnormal weight gain: Secondary | ICD-10-CM | POA: Diagnosis not present

## 2018-09-12 DIAGNOSIS — Z1211 Encounter for screening for malignant neoplasm of colon: Secondary | ICD-10-CM | POA: Diagnosis not present

## 2018-09-12 DIAGNOSIS — K219 Gastro-esophageal reflux disease without esophagitis: Secondary | ICD-10-CM | POA: Diagnosis not present

## 2018-09-13 ENCOUNTER — Other Ambulatory Visit: Payer: Self-pay | Admitting: Gastroenterology

## 2018-09-19 DIAGNOSIS — M7989 Other specified soft tissue disorders: Secondary | ICD-10-CM | POA: Diagnosis not present

## 2018-09-19 DIAGNOSIS — I4821 Permanent atrial fibrillation: Secondary | ICD-10-CM | POA: Diagnosis not present

## 2018-09-20 LAB — BASIC METABOLIC PANEL
BUN/Creatinine Ratio: 22 (ref 12–28)
BUN: 17 mg/dL (ref 8–27)
CO2: 23 mmol/L (ref 20–29)
Calcium: 9.2 mg/dL (ref 8.7–10.3)
Chloride: 106 mmol/L (ref 96–106)
Creatinine, Ser: 0.76 mg/dL (ref 0.57–1.00)
GFR calc Af Amer: 97 mL/min/{1.73_m2} (ref >59)
GFR calc non Af Amer: 84 mL/min/{1.73_m2} (ref >59)
Glucose: 116 mg/dL — ABNORMAL HIGH (ref 65–99)
Potassium: 3.9 mmol/L (ref 3.5–5.2)
Sodium: 143 mmol/L (ref 134–144)

## 2018-09-22 ENCOUNTER — Other Ambulatory Visit: Payer: Self-pay | Admitting: Cardiology

## 2018-09-26 DIAGNOSIS — M25474 Effusion, right foot: Secondary | ICD-10-CM | POA: Diagnosis not present

## 2018-09-26 DIAGNOSIS — M25471 Effusion, right ankle: Secondary | ICD-10-CM | POA: Diagnosis not present

## 2018-09-26 DIAGNOSIS — I10 Essential (primary) hypertension: Secondary | ICD-10-CM | POA: Diagnosis not present

## 2018-09-26 DIAGNOSIS — G6289 Other specified polyneuropathies: Secondary | ICD-10-CM | POA: Diagnosis not present

## 2018-09-26 DIAGNOSIS — M25475 Effusion, left foot: Secondary | ICD-10-CM | POA: Diagnosis not present

## 2018-10-09 DIAGNOSIS — L821 Other seborrheic keratosis: Secondary | ICD-10-CM | POA: Diagnosis not present

## 2018-10-09 DIAGNOSIS — Z85828 Personal history of other malignant neoplasm of skin: Secondary | ICD-10-CM | POA: Diagnosis not present

## 2018-10-09 DIAGNOSIS — L814 Other melanin hyperpigmentation: Secondary | ICD-10-CM | POA: Diagnosis not present

## 2018-10-09 DIAGNOSIS — D2362 Other benign neoplasm of skin of left upper limb, including shoulder: Secondary | ICD-10-CM | POA: Diagnosis not present

## 2018-10-23 ENCOUNTER — Other Ambulatory Visit: Payer: Self-pay | Admitting: Registered"

## 2018-10-23 DIAGNOSIS — Z20822 Contact with and (suspected) exposure to covid-19: Secondary | ICD-10-CM

## 2018-10-23 DIAGNOSIS — Z20828 Contact with and (suspected) exposure to other viral communicable diseases: Secondary | ICD-10-CM | POA: Diagnosis not present

## 2018-10-25 LAB — NOVEL CORONAVIRUS, NAA: SARS-CoV-2, NAA: NOT DETECTED

## 2018-10-30 ENCOUNTER — Other Ambulatory Visit (HOSPITAL_COMMUNITY)
Admission: RE | Admit: 2018-10-30 | Discharge: 2018-10-30 | Disposition: A | Discharge status: Home / Self Care | Payer: BC Managed Care – PPO | Source: Ambulatory Visit | Attending: Gastroenterology | Admitting: Gastroenterology

## 2018-10-30 DIAGNOSIS — Z20828 Contact with and (suspected) exposure to other viral communicable diseases: Secondary | ICD-10-CM | POA: Diagnosis not present

## 2018-10-30 DIAGNOSIS — Z01812 Encounter for preprocedural laboratory examination: Secondary | ICD-10-CM | POA: Insufficient documentation

## 2018-10-30 LAB — SARS CORONAVIRUS 2 (TAT 6-24 HRS): SARS Coronavirus 2: NEGATIVE

## 2018-10-31 ENCOUNTER — Encounter (HOSPITAL_COMMUNITY): Payer: Self-pay | Admitting: *Deleted

## 2018-10-31 ENCOUNTER — Ambulatory Visit: Payer: BC Managed Care – PPO | Admitting: Cardiology

## 2018-11-02 ENCOUNTER — Encounter (HOSPITAL_COMMUNITY): Payer: Self-pay

## 2018-11-02 ENCOUNTER — Ambulatory Visit (HOSPITAL_COMMUNITY)
Admission: RE | Admit: 2018-11-02 | Discharge: 2018-11-02 | Disposition: A | Discharge status: Home / Self Care | Payer: BC Managed Care – PPO | Attending: Gastroenterology | Admitting: Gastroenterology

## 2018-11-02 ENCOUNTER — Ambulatory Visit (HOSPITAL_COMMUNITY): Payer: BC Managed Care – PPO | Admitting: Anesthesiology

## 2018-11-02 ENCOUNTER — Encounter (HOSPITAL_COMMUNITY)
Admission: RE | Disposition: A | Discharge status: Home / Self Care | Payer: Self-pay | Source: Home / Self Care | Attending: Gastroenterology

## 2018-11-02 ENCOUNTER — Other Ambulatory Visit: Payer: Self-pay

## 2018-11-02 DIAGNOSIS — I10 Essential (primary) hypertension: Secondary | ICD-10-CM | POA: Diagnosis not present

## 2018-11-02 DIAGNOSIS — Z79890 Hormone replacement therapy (postmenopausal): Secondary | ICD-10-CM | POA: Diagnosis not present

## 2018-11-02 DIAGNOSIS — E785 Hyperlipidemia, unspecified: Secondary | ICD-10-CM | POA: Insufficient documentation

## 2018-11-02 DIAGNOSIS — Z1211 Encounter for screening for malignant neoplasm of colon: Secondary | ICD-10-CM | POA: Diagnosis not present

## 2018-11-02 DIAGNOSIS — K5730 Diverticulosis of large intestine without perforation or abscess without bleeding: Secondary | ICD-10-CM | POA: Diagnosis not present

## 2018-11-02 DIAGNOSIS — K648 Other hemorrhoids: Secondary | ICD-10-CM | POA: Diagnosis not present

## 2018-11-02 DIAGNOSIS — G4730 Sleep apnea, unspecified: Secondary | ICD-10-CM | POA: Diagnosis not present

## 2018-11-02 DIAGNOSIS — K635 Polyp of colon: Secondary | ICD-10-CM | POA: Diagnosis not present

## 2018-11-02 DIAGNOSIS — D124 Benign neoplasm of descending colon: Secondary | ICD-10-CM | POA: Diagnosis not present

## 2018-11-02 DIAGNOSIS — K625 Hemorrhage of anus and rectum: Secondary | ICD-10-CM | POA: Insufficient documentation

## 2018-11-02 DIAGNOSIS — K219 Gastro-esophageal reflux disease without esophagitis: Secondary | ICD-10-CM | POA: Diagnosis not present

## 2018-11-02 DIAGNOSIS — Z888 Allergy status to other drugs, medicaments and biological substances status: Secondary | ICD-10-CM | POA: Insufficient documentation

## 2018-11-02 DIAGNOSIS — I4891 Unspecified atrial fibrillation: Secondary | ICD-10-CM | POA: Diagnosis not present

## 2018-11-02 DIAGNOSIS — Z7901 Long term (current) use of anticoagulants: Secondary | ICD-10-CM | POA: Diagnosis not present

## 2018-11-02 DIAGNOSIS — Z79899 Other long term (current) drug therapy: Secondary | ICD-10-CM | POA: Insufficient documentation

## 2018-11-02 HISTORY — PX: POLYPECTOMY: SHX5525

## 2018-11-02 HISTORY — DX: Other specified postprocedural states: R11.2

## 2018-11-02 HISTORY — DX: Other specified postprocedural states: Z98.890

## 2018-11-02 HISTORY — PX: COLONOSCOPY WITH PROPOFOL: SHX5780

## 2018-11-02 SURGERY — COLONOSCOPY WITH PROPOFOL
Anesthesia: Monitor Anesthesia Care

## 2018-11-02 MED ORDER — PROPOFOL 500 MG/50ML IV EMUL
INTRAVENOUS | Status: DC | PRN
  Administered 2018-11-02: 50 ug/kg/min via INTRAVENOUS

## 2018-11-02 MED ORDER — SODIUM CHLORIDE 0.9 % IV SOLN: INTRAVENOUS | Status: DC

## 2018-11-02 MED ORDER — PROPOFOL 10 MG/ML IV BOLUS
INTRAVENOUS | Status: DC | PRN
  Administered 2018-11-02 (×2): 30 mg via INTRAVENOUS
  Administered 2018-11-02: 20 mg via INTRAVENOUS
  Administered 2018-11-02: 50 mg via INTRAVENOUS

## 2018-11-02 MED ORDER — PROPOFOL 500 MG/50ML IV EMUL
INTRAVENOUS | Status: AC
  Filled 2018-11-02: qty 50

## 2018-11-02 MED ORDER — PROPOFOL 10 MG/ML IV BOLUS
INTRAVENOUS | Status: AC
  Filled 2018-11-02: qty 20

## 2018-11-02 MED ORDER — LACTATED RINGERS IV SOLN
INTRAVENOUS | Status: DC
  Administered 2018-11-02: 07:00:00 via INTRAVENOUS

## 2018-11-02 SURGICAL SUPPLY — 22 items

## 2018-11-02 NOTE — Op Note (Signed)
[facility] Patient Name: Christina Mason Procedure Date: 11/02/2018 MRN: [id number] Attending MD: Juanita Craver , MD Date of Birth: December 04, 1955 CSN: [id number] Age: 63 Admit Type: Outpatient Procedure:                Colonoscopy with cold biopsy x 1. Indications:              Rectal bleeding; CRC screening for colorectal                            malignant neoplasm. Providers:                Juanita Craver, MD, Carmie End, RN, Heide Scales,                            CRNA Referring MD:             Domingo Pulse. [name], Evette Cristal, MD Medicines:                Monitored Anesthesia Care Complications:            No immediate complications. Estimated Blood Loss:     Estimated blood loss was minimal. Procedure:                Pre-Anesthesia Assessment: - Prior to the                            procedure, a history and physical was performed,                            and patient medications and allergies were                            reviewed. The patient's tolerance of previous                            anesthesia was also reviewed. The risks and                            benefits of the procedure and the sedation options                            and risks were discussed with the patient. All                            questions were answered, and informed consent was                            obtained. Prior Anticoagulants: The patient has                            taken Eliquis (apixaban), last dose was 3 days                            prior to procedure. ASA Grade Assessment: III - A  patient with severe systemic disease. After                            reviewing the risks and benefits, the patient was                            deemed in satisfactory condition to undergo the                            procedure. After obtaining informed consent, the                            colonoscope was passed under direct vision.                    Throughout the procedure, the patient's blood                            pressure, pulse, and oxygen saturations were                            monitored continuously. The CF-HQ190L ([id number])                            Olympus colonoscope was introduced through the anus                            and advanced to the the cecum, identified by                            appendiceal orifice and ileocecal valve. The                            colonoscopy was performed without difficulty. The                            patient tolerated the procedure well. The quality                            of the bowel preparation was adequate. The                            ileocecal valve, the appendiceal orifice and the                            rectum were photographed. The bowel preparation                            used was GoLYTELY via split dose instruction. Scope In: 7:36:50 AM Scope Out: 7:52:29 AM Scope Withdrawal Time: 0 hours 12 minutes 11 seconds  Total Procedure Duration: 0 hours 15 minutes 39 seconds  Findings:      A few small and large-mouthed diverticula were found in the sigmoid       colon.      A small sessile polyp was found in the  mid-descending colon-this was       removed by cold biopsy x 1.      Small internal hemorrhoids were noted on retroflexion. Impression:               - Few scattered diverticula in the sigmoid colon.                           - One small sessile polyp in the mid-descending                            colon-removed by cold biopsy x 1..                           - Small internal hemorrhoids. Moderate Sedation:      MAC used. Recommendation:           - High fiber diet with augmented water consumption                            daily.                           - Continue present medications.                           - Await pathology results.                           - Repeat colonoscopy in 7 years for surveillance.                            - Return to GI office PRN.                           - If the patient has any abnormal GI symptoms in                            the interim, she has been advised to call the                            office ASAP for further recommendations. Procedure Code(s):        --- Professional ---                           [id number], Colonoscopy, flexible; with biopsy, single                            or multiple Diagnosis Code(s):        --- Professional ---                           K62.5, Hemorrhage of anus and rectum                           K57.30, Diverticulosis of large intestine without  perforation or abscess without bleeding                           D12.4, Benign neoplasm of descending colon                           Z12.11, Encounter for screening for malignant                            neoplasm of colon CPT copyright 2019 [employer] Association. All rights reserved. The codes documented in this report are preliminary and upon coder review may  be revised to meet current compliance requirements. Juanita Craver, MD Juanita Craver, MD 11/02/2018 8:06:20 AM This report has been signed electronically. Number of Addenda: 0

## 2018-11-02 NOTE — Anesthesia Procedure Notes (Signed)
Procedure Name: MAC Date/Time: 11/02/2018 7:43 AM Performed by: Deliah Boston, CRNA Pre-anesthesia Checklist: Patient identified, Emergency Drugs available, Suction available and Patient being monitored Patient Re-evaluated:Patient Re-evaluated prior to induction Oxygen Delivery Method: Simple face mask Preoxygenation: Pre-oxygenation with 100% oxygen Induction Type: IV induction Placement Confirmation: positive ETCO2 and breath sounds checked- equal and bilateral Dental Injury: Teeth and Oropharynx as per pre-operative assessment

## 2018-11-02 NOTE — H&P (Signed)
Christina Mason is an 63 y.o. female.   Chief Complaint: Rectal bleeding. HPI: Christina Mason is a 63 year old white female presents to the hospital today for colorectal cancer screening.  She had a history of rectal bleeding recently.  See office notes for details.  Past Medical History:  Diagnosis Date  . Atrial fibrillation ([place])   . Hyperlipidemia   . Hypertension   . Obesity   . PONV (postoperative nausea and vomiting)    Past Surgical History:  Procedure Laterality Date  . CHOLECYSTECTOMY  feb 1997  . TOTAL ABDOMINAL HYSTERECTOMY  feb 1997   Family History  Problem Relation Age of Onset  . Asthma Mother   . Heart disease Mother   . Rheum arthritis Mother   . CAD Mother   . Hypertension Mother   . Stroke Mother   . Dementia Mother   . Alcoholism Father   . Hyperlipidemia Sister   . Hypertension Brother    Social History:  reports that she has never smoked. She has never used smokeless tobacco. She reports current alcohol use. She reports that she does not use drugs.  Allergies:  Allergies  Allergen Reactions  . Lisinopril Cough   Medications Prior to Admission  Medication Sig Dispense Refill  . acetaminophen (TYLENOL) 650 MG CR tablet Take 1,300 mg by mouth at bedtime.    . Cholecalciferol (VITAMIN D-3) 125 MCG (5000 UT) TABS Take 5,000 Units by mouth daily.    . Coenzyme Q10 (COQ10) 200 MG CAPS Take 400 mg by mouth daily.    . DULoxetine (CYMBALTA) 20 MG capsule Take 20 mg by mouth daily.    . DULoxetine (CYMBALTA) 60 MG capsule Take 60 mg by mouth every evening.     Marland Kitchen ELIQUIS 5 MG TABS tablet TAKE 1 TABLET BY MOUTH TWICE A DAY (Patient taking differently: Take 5 mg by mouth 2 (two) times daily. ) 180 tablet 4  . furosemide (LASIX) 40 MG tablet Take 1 tablet (40 mg total) by mouth daily. 90 tablet 3  . metoprolol succinate (TOPROL-XL) 100 MG 24 hr tablet TAKE 1 TABLET IN THE MORNING AND 1/2 TABLET IN THE EVENING (Patient taking differently: Take 50-100 mg by mouth See  admin instructions. Take 1 tablet (100 mg) by mouth in the morning & take 0.5 tablet (50 mg) by mouth at night.) 135 tablet 1  . Multiple Vitamin (MULTIVITAMIN WITH MINERALS) TABS tablet Take 1 tablet by mouth daily. Centrum for Women 50+    . Omega-3 Fatty Acids (FISH OIL) 1200 MG CAPS Take 1,200 mg by mouth 2 (two) times daily.    Marland Kitchen omeprazole (PRILOSEC) 20 MG capsule Take 20 mg by mouth daily.      . potassium chloride (K-DUR) 10 MEQ tablet Take 1 tablet (10 mEq total) by mouth daily. 90 tablet 1  . PREMARIN 0.625 MG tablet Take 0.625 mg by mouth daily.    . simvastatin (ZOCOR) 20 MG tablet Take 20 mg by mouth every evening.     Marland Kitchen telmisartan (MICARDIS) 80 MG tablet TAKE 1 TABLET BY MOUTH EVERY DAY (Patient taking differently: Take 80 mg by mouth every evening. ) 90 tablet 1  . TURMERIC PO Take 2 capsules by mouth daily.     Review of Systems  Constitutional: Negative.   HENT: Negative.   Gastrointestinal: Positive for blood in stool and heartburn. Negative for abdominal pain, constipation, diarrhea, nausea and vomiting.  Genitourinary: Negative.   Skin: Negative.   Neurological: Negative.  Endo/Heme/Allergies: Negative.   Psychiatric/Behavioral: Negative.    Blood pressure 137/64, pulse (!) 101, temperature 97.8 F (36.6 C), temperature source Oral, resp. rate 18, SpO2 100 %. Physical Exam  Constitutional: She is oriented to person, place, and time. She appears well-developed and well-nourished.  Morbidly obese  HENT:  Head: Normocephalic and atraumatic.  Eyes: Conjunctivae and EOM are normal.  Neck: Normal range of motion. Neck supple.  Cardiovascular: Normal rate and regular rhythm.  Respiratory: Effort normal and breath sounds normal.  GI: Soft. Bowel sounds are normal.  Musculoskeletal: Normal range of motion.  Neurological: She is alert and oriented to person, place, and time.  Skin: Skin is warm and dry.  Psychiatric: She has a normal mood and affect. Her behavior is  normal. Judgment and thought content normal.   Assessment/Plan Rectal bleeding/Colorectal cancer screening: proceed with a colonoscopy at this time.  Juanita Craver, MD 11/02/2018, 7:14 AM

## 2018-11-02 NOTE — Anesthesia Preprocedure Evaluation (Addendum)
Anesthesia Evaluation  Patient identified by MRN, date of birth, ID band Patient awake    Reviewed: Allergy & Precautions, NPO status , Patient's Chart, lab work & pertinent test results, reviewed documented beta blocker date and time   History of Anesthesia Complications (+) PONV and history of anesthetic complications  Airway Mallampati: II  TM Distance: >3 FB Neck ROM: Full    Dental  (+) Dental Advisory Given   Pulmonary sleep apnea and Continuous Positive Airway Pressure Ventilation , neg recent URI,    breath sounds clear to auscultation       Cardiovascular hypertension, Pt. on medications and Pt. on home beta blockers + dysrhythmias Atrial Fibrillation  Rhythm:Irregular     Neuro/Psych negative neurological ROS  negative psych ROS   GI/Hepatic Neg liver ROS, GERD  Medicated,  Endo/Other  negative endocrine ROS  Renal/GU negative Renal ROS     Musculoskeletal   Abdominal   Peds  Hematology eliquis   Anesthesia Other Findings  1. The left ventricle has hyperdynamic systolic function, with an ejection fraction of >65%. The cavity size was normal. Left ventricular diastolic parameters were normal.  2. The right ventricle has normal systolic function. The cavity was normal. There is no increase in right ventricular wall thickness.  3. No stenosis of the aortic valve.  4. The aorta is normal in size and structure.  Reproductive/Obstetrics                            Anesthesia Physical Anesthesia Plan  ASA: II  Anesthesia Plan: MAC   Post-op Pain Management:    Induction: Intravenous  PONV Risk Score and Plan: 3 and Treatment may vary due to age or medical condition and Propofol infusion  Airway Management Planned: Nasal Cannula  Additional Equipment: None  Intra-op Plan:   Post-operative Plan:   Informed Consent: I have reviewed the patients History and Physical, chart, labs  and discussed the procedure including the risks, benefits and alternatives for the proposed anesthesia with the patient or authorized representative who has indicated his/her understanding and acceptance.     Dental advisory given  Plan Discussed with: CRNA and Surgeon  Anesthesia Plan Comments:         Anesthesia Quick Evaluation

## 2018-11-02 NOTE — Transfer of Care (Signed)
Immediate Anesthesia Transfer of Care Note  Patient: Christina Mason  Procedure(s) Performed: Procedure(s): COLONOSCOPY WITH PROPOFOL (N/A) POLYPECTOMY  Patient Location: PACU  Anesthesia Type:MAC  Level of Consciousness: Patient easily awoken, sedated, comfortable, cooperative, following commands, responds to stimulation.   Airway & Oxygen Therapy: Patient spontaneously breathing, ventilating well, oxygen via simple oxygen mask.  Post-op Assessment: Report given to PACU RN, vital signs reviewed and stable, moving all extremities.   Post vital signs: Reviewed and stable.  Complications: No apparent anesthesia complications  Last Vitals:  Vitals Value Taken Time  BP 105/66 11/02/18 0801  Temp 37.4 C 11/02/18 0801  Pulse 100 11/02/18 0802  Resp 21 11/02/18 0802  SpO2 100 % 11/02/18 0802  Vitals shown include unvalidated device data.  Last Pain:  Vitals:   11/02/18 0801  TempSrc: Temporal  PainSc: 0-No pain         Complications: No apparent anesthesia complications

## 2018-11-02 NOTE — Discharge Instructions (Signed)

## 2018-11-03 LAB — SURGICAL PATHOLOGY

## 2018-11-09 NOTE — Anesthesia Postprocedure Evaluation (Addendum)
Anesthesia Post Note  Patient: Christina Mason  Procedure(s) Performed: COLONOSCOPY WITH PROPOFOL (N/A ) POLYPECTOMY     Patient location during evaluation: Endoscopy Anesthesia Type: MAC Level of consciousness: awake and alert Pain management: pain level controlled Vital Signs Assessment: post-procedure vital signs reviewed and stable Respiratory status: spontaneous breathing, nonlabored ventilation, respiratory function stable and patient connected to nasal cannula oxygen Cardiovascular status: stable and blood pressure returned to baseline Postop Assessment: no apparent nausea or vomiting Anesthetic complications: no    Last Vitals:  Vitals:   11/02/18 0810 11/02/18 0820  BP: 121/68 (!) 124/50  Pulse: 75 84  Resp: 19 19  Temp:    SpO2:  100%    Last Pain:  Vitals:   11/03/18 1235  TempSrc:   PainSc: 0-No pain                 Mckaela Howley

## 2018-11-15 DIAGNOSIS — H2513 Age-related nuclear cataract, bilateral: Secondary | ICD-10-CM | POA: Diagnosis not present

## 2018-11-20 ENCOUNTER — Ambulatory Visit: Payer: BC Managed Care – PPO | Admitting: Family

## 2018-11-20 ENCOUNTER — Other Ambulatory Visit: Payer: Self-pay

## 2018-11-20 ENCOUNTER — Encounter: Payer: Self-pay | Admitting: Family

## 2018-11-20 VITALS — BP 122/74 | HR 86 | Ht 64.0 in | Wt 235.0 lb

## 2018-11-20 DIAGNOSIS — I4821 Permanent atrial fibrillation: Secondary | ICD-10-CM | POA: Diagnosis not present

## 2018-11-20 DIAGNOSIS — Z7901 Long term (current) use of anticoagulants: Secondary | ICD-10-CM | POA: Diagnosis not present

## 2018-11-20 DIAGNOSIS — I10 Essential (primary) hypertension: Secondary | ICD-10-CM

## 2018-11-20 DIAGNOSIS — M7989 Other specified soft tissue disorders: Secondary | ICD-10-CM

## 2018-11-20 DIAGNOSIS — E782 Mixed hyperlipidemia: Secondary | ICD-10-CM

## 2018-11-20 MED ORDER — FUROSEMIDE 40 MG PO TABS: ORAL_TABLET | ORAL | 3 refills | Status: DC

## 2018-11-20 NOTE — Progress Notes (Signed)
Office Visit    Patient Name: Christina Mason Date of Encounter: 11/20/2018  Primary Care Provider:  Chesley Noon, MD Primary Cardiologist:  Jenne Campus, MD Electrophysiologist:  None   Chief Complaint    Christina Mason is a 63 y.o. female with a hx of f permanent atrial fibrillation on chronic anticoagulation, HTN, HLD, LE edema, OSA presents today for LE edema.   Past Medical History    Past Medical History:  Diagnosis Date  . Atrial fibrillation ([place])   . Hyperlipidemia   . Hypertension   . Obesity   . PONV (postoperative nausea and vomiting)    Past Surgical History:  Procedure Laterality Date  . CHOLECYSTECTOMY  feb 1997  . COLONOSCOPY WITH PROPOFOL N/A 11/02/2018   Procedure: COLONOSCOPY WITH PROPOFOL;  Surgeon: Juanita Craver, MD;  Location: [facility];  Service: Endoscopy;  Laterality: N/A;  . POLYPECTOMY  11/02/2018   Procedure: POLYPECTOMY;  Surgeon: Juanita Craver, MD;  Location: [facility];  Service: Endoscopy;;  . TOTAL ABDOMINAL HYSTERECTOMY  feb 1997    Allergies  Allergies  Allergen Reactions  . Lisinopril Cough    History of Present Illness    Christina Mason is a 63 y.o. female with a hx of permanent atrial fibrillation on chronic anticoagulation, HTN, HLD, LE edema, OSA last seen 09/06/18 by Dr. Agustin Cree.  Echo 08/11/18 with EF >65%, normal LV diastolic parameters, no significant valvular abnormalities. Her HCTZ was previously changed to Lasix and K by Dr. Agustin Cree.   Colonoscopy 11/02/18 for rectal bleeding. Noted few diverticula in sigmoid colon, one poly in mid-descending colon removed by cold biopsy - recommended for "high fiber diet with augmented water consumption".   At visit 09/06/2018 her HCTZ 25 mg is changed to Lasix 40 mg daily with potassium.  She feels the swelling in her feet and legs is more noticeable.  Reports her blood pressures been well controlled at home.  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  She notices some improvement with compression stockings.  We discussed that her echo shows that her heart has good strong pumping function and some of her lower extremity edema is likely due to venous insufficiency.  She has venous stasis changes noted to her lower extremities bilaterally and she tells me these are old.  She denies chest pain, shortness of breath, dyspnea on exertion, palpitations.   EKGs/Labs/Other Studies Reviewed:   The following studies were reviewed today:  Echo 08/11/18  1. The left ventricle has hyperdynamic systolic function, with an ejection fraction of >65%. The cavity size was normal. Left ventricular diastolic parameters were normal.  2. The right ventricle has normal systolic function. The cavity was normal. There is no increase in right ventricular wall thickness.  3. No stenosis of the aortic valve.  4. The aorta is normal in size and structure.  EKG: No EKG today  Recent Labs: 09/06/2018: NT-Pro BNP 316 09/19/2018: BUN 17; Creatinine, Ser 0.76; Potassium 3.9; Sodium 143  Recent Lipid Panel No results found for: CHOL, TRIG, HDL, CHOLHDL, VLDL, LDLCALC, LDLDIRECT  Home Medications   Current Meds  Medication Sig  . acetaminophen (TYLENOL) 650 MG CR tablet Take 1,300 mg by mouth at bedtime.  . Cholecalciferol (VITAMIN D-3) 125 MCG (5000 UT) TABS Take 5,000 Units by mouth daily.  . Coenzyme Q10 (COQ10) 200 MG CAPS Take 400 mg by mouth daily.  . DULoxetine (CYMBALTA) 20 MG capsule Take 20 mg  by mouth daily.  . DULoxetine (CYMBALTA) 60 MG capsule Take 60 mg by mouth 4 (four) times a week. 4 times weekly Sat, Sun, Tuesdays, Thursdays  . ELIQUIS 5 MG TABS tablet TAKE 1 TABLET BY MOUTH TWICE A DAY (Patient taking differently: Take 5 mg by mouth 2 (two) times daily. )  . furosemide (LASIX) 40 MG tablet Take 20mg  (half tablet) in the morning and 20mg  (half tablet) in the afternoon.  . metoprolol succinate (TOPROL-XL) 100 MG 24 hr tablet TAKE 1 TABLET IN THE  MORNING AND 1/2 TABLET IN THE EVENING (Patient taking differently: Take 50-100 mg by mouth See admin instructions. Take 1 tablet (100 mg) by mouth in the morning & take 0.5 tablet (50 mg) by mouth at night.)  . Multiple Vitamin (MULTIVITAMIN WITH MINERALS) TABS tablet Take 1 tablet by mouth daily. Centrum for Women 50+  . Omega-3 Fatty Acids (FISH OIL) 1200 MG CAPS Take 1,200 mg by mouth 2 (two) times daily.  Marland Kitchen omeprazole (PRILOSEC) 20 MG capsule Take 20 mg by mouth daily.    . potassium chloride (K-DUR) 10 MEQ tablet Take 1 tablet (10 mEq total) by mouth daily.  Marland Kitchen PREMARIN 0.625 MG tablet Take 0.625 mg by mouth daily.  . simvastatin (ZOCOR) 20 MG tablet Take 20 mg by mouth every evening.   Marland Kitchen telmisartan (MICARDIS) 80 MG tablet TAKE 1 TABLET BY MOUTH EVERY DAY (Patient taking differently: Take 80 mg by mouth every evening. )  . TURMERIC PO Take 2 capsules by mouth daily.  . [DISCONTINUED] furosemide (LASIX) 40 MG tablet Take 1 tablet (40 mg total) by mouth daily.      Review of Systems   All other systems reviewed and are otherwise negative except as noted above.  Physical Exam    VS:  BP 122/74 (BP Location: Right Arm, Patient Position: Sitting, Cuff Size: Normal)   Pulse 86   Ht 5\' 4"  (1.626 m)   Wt 235 lb (106.6 kg)   SpO2 97%   BMI 40.34 kg/m  , BMI Body mass index is 40.34 kg/m. GEN: Well nourished, well developed, in no acute distress. HEENT: normal. Neck: Supple, no JVD, carotid bruits, or masses. Cardiac: RRR, no murmurs, rubs, or gallops. No clubbing, cyanosis.  1+ pitting edema to bilateral lower extremity from foot to mid calf.  Chronic venous stasis changes noticed.  Radials/DP/PT 2+ and equal bilaterally.  Respiratory:  Respirations regular and unlabored, clear to auscultation bilaterally. GI: Soft, nontender, nondistended, BS + x 4. MS: No deformity or atrophy. Skin: Warm and dry, no rash. Neuro:  Strength and sensation are intact. Psych: Normal affect.  Assessment  & Plan    1. Lower extremity edema- Previously switched from HCTZ to Lasix and feels her swelling is worse.  Echo 07/2018 with normal EF, no evidence of heart failure.  Likely etiology venous insufficiency.  Noted chronic venous stasis changes to bilateral lower extremity. She feels the Lasix wears off quickly, CHANGE to 20mg  am and 20mg  in the afternoon.  Recommended elevating lower extremities when sitting, low-salt diet, compression stockings.  She will send a MyChart message in 1 week to let us know what her blood pressures have been and if her swelling has improved.  At that time we will consider resuming HCTZ as it previously had a good effect on her edema.  Hesistant to resume HCTZ today as her blood pressure has been low normal when checked at home.  2. Chronic atrial fibrillation-rate controlled today.  She  will continue metoprolol for rate control.  Appropriately anticoagulated on Eliquis.  3. Chronic anticoagulation-secondary to chronic atrial fibrillation.  Denies bleeding complications.  Continue Eliquis.  4. Hypertension-blood pressure well controlled.  Continue present antihypertensive regimen.  5. Hyperlipidemia-follows with her PCP.  Continue simvastatin.  Disposition: Follow up in 3 month(s) with Dr. Moshe Cipro, NP 11/20/2018, 5:41 PM

## 2018-11-20 NOTE — Patient Instructions (Addendum)
Medication Instructions:   Your physician has recommended you make the following change in your medication:   CHANGE Lasix to 20mg  (half tablet) in the morning and 20mg  (half tablet) in the afternoon.   *If you need a refill on your cardiac medications before your next appointment, please call your pharmacy*  Lab Work: No lab work today.   If you have labs (blood work) drawn today and your tests are completely normal, you will receive your results only by: Marland Kitchen MyChart Message (if you have MyChart) OR . A paper copy in the mail If you have any lab test that is abnormal or we need to change your treatment, we will call you to review the results.  Testing/Procedures: You had an EKG today.   Follow-Up: At [facility], you and your health needs are our priority.  As part of our continuing mission to provide you with exceptional heart care, we have created designated Provider Care Teams.  These Care Teams include your primary Cardiologist (physician) and Advanced Practice Providers (APPs -  Physician Assistants and Nurse Practitioners) who all work together to provide you with the care you need, when you need it.  Your next appointment:   3 months  The format for your next appointment:   In Person  Provider:   Jenne Campus, MD  Other Instructions  Please send MyChart message in 1 week to let us know how your swelling and blood pressure are doing. At that point we will decide how to further change your plan of care if needed.  Your echo shows good heart pumping function so no concern that your lower extremity swelling due to heart failure! Likely cause is 'venous insufficiency'   Recommend compression stockings Recommend lower extremity elevation Recommend low salt diet   Edema  Edema is when you have too much fluid in your body or under your skin. Edema may make your legs, feet, and ankles swell up. Swelling is also common in looser tissues, like around your eyes. This is a  common condition. It gets more common as you get older. There are many possible causes of edema. Eating too much salt (sodium) and being on your feet or sitting for a long time can cause edema in your legs, feet, and ankles. Hot weather may make edema worse. Edema is usually painless. Your skin may look swollen or shiny. Follow these instructions at home:  Keep the swollen body part raised (elevated) above the level of your heart when you are sitting or lying down.  Do not sit still or stand for a long time.  Exercise your legs. This can help the swelling go down.  Wear elastic bandages or support stockings as told by your doctor.  Eat a low-salt (low-sodium) diet to reduce fluid as told by your doctor.  Depending on the cause of your swelling, you may need to limit how much fluid you drink (fluid restriction).  Take over-the-counter and prescription medicines only as told by your doctor. Contact a doctor if:  Treatment is not working.  You have heart, liver, or kidney disease and have symptoms of edema.  You have sudden and unexplained weight gain. Get help right away if:  You have shortness of breath or chest pain.  You cannot breathe when you lie down.  You have pain, redness, or warmth in the swollen areas.  You have heart, liver, or kidney disease and get edema all of a sudden.  You have a fever and your symptoms get  worse all of a sudden. Summary  Edema is when you have too much fluid in your body or under your skin.  Edema may make your legs, feet, and ankles swell up. Swelling is also common in looser tissues, like around your eyes.  Raise (elevate) the swollen body part above the level of your heart when you are sitting or lying down.  Follow your doctor's instructions about diet and how much fluid you can drink (fluid restriction). This information is not intended to replace advice given to you by your health care provider. Make sure you discuss any questions you  have with your health care provider. Document Released: 06/23/2007 Document Revised: 01/07/2017 Document Reviewed: 01/23/2016 Elsevier Patient Education  2020 [employer].

## 2018-11-22 DIAGNOSIS — G4733 Obstructive sleep apnea (adult) (pediatric): Secondary | ICD-10-CM | POA: Diagnosis not present

## 2018-11-24 ENCOUNTER — Encounter: Payer: Self-pay | Admitting: Family

## 2018-12-25 ENCOUNTER — Encounter: Payer: Self-pay | Admitting: Family

## 2018-12-26 DIAGNOSIS — G6289 Other specified polyneuropathies: Secondary | ICD-10-CM | POA: Diagnosis not present

## 2018-12-26 DIAGNOSIS — R600 Localized edema: Secondary | ICD-10-CM | POA: Diagnosis not present

## 2018-12-26 DIAGNOSIS — I10 Essential (primary) hypertension: Secondary | ICD-10-CM | POA: Diagnosis not present

## 2019-02-06 DIAGNOSIS — I10 Essential (primary) hypertension: Secondary | ICD-10-CM | POA: Diagnosis not present

## 2019-02-06 DIAGNOSIS — Z7901 Long term (current) use of anticoagulants: Secondary | ICD-10-CM | POA: Diagnosis not present

## 2019-02-06 DIAGNOSIS — I4821 Permanent atrial fibrillation: Secondary | ICD-10-CM | POA: Diagnosis not present

## 2019-02-06 DIAGNOSIS — M79605 Pain in left leg: Secondary | ICD-10-CM | POA: Diagnosis not present

## 2019-02-06 DIAGNOSIS — R600 Localized edema: Secondary | ICD-10-CM | POA: Diagnosis not present

## 2019-02-06 DIAGNOSIS — E782 Mixed hyperlipidemia: Secondary | ICD-10-CM | POA: Diagnosis not present

## 2019-02-06 MED ORDER — HYDROCHLOROTHIAZIDE 25 MG PO TABS: 25.0000 mg | ORAL_TABLET | Freq: Every day | ORAL | 3 refills | Status: DC

## 2019-02-15 DIAGNOSIS — Z1231 Encounter for screening mammogram for malignant neoplasm of breast: Secondary | ICD-10-CM | POA: Diagnosis not present

## 2019-02-15 DIAGNOSIS — Z01419 Encounter for gynecological examination (general) (routine) without abnormal findings: Secondary | ICD-10-CM | POA: Diagnosis not present

## 2019-02-15 DIAGNOSIS — Z6839 Body mass index (BMI) 39.0-39.9, adult: Secondary | ICD-10-CM | POA: Diagnosis not present

## 2019-02-20 DIAGNOSIS — M4726 Other spondylosis with radiculopathy, lumbar region: Secondary | ICD-10-CM | POA: Diagnosis not present

## 2019-02-20 DIAGNOSIS — G8929 Other chronic pain: Secondary | ICD-10-CM | POA: Diagnosis not present

## 2019-02-20 DIAGNOSIS — I10 Essential (primary) hypertension: Secondary | ICD-10-CM | POA: Diagnosis not present

## 2019-02-20 DIAGNOSIS — M5116 Intervertebral disc disorders with radiculopathy, lumbar region: Secondary | ICD-10-CM | POA: Diagnosis not present

## 2019-02-20 DIAGNOSIS — M5442 Lumbago with sciatica, left side: Secondary | ICD-10-CM | POA: Diagnosis not present

## 2019-02-20 DIAGNOSIS — M1612 Unilateral primary osteoarthritis, left hip: Secondary | ICD-10-CM | POA: Diagnosis not present

## 2019-02-21 ENCOUNTER — Other Ambulatory Visit: Payer: Self-pay | Admitting: Cardiology

## 2019-02-21 NOTE — Telephone Encounter (Signed)
Rx refill sent to pharmacy. 

## 2019-02-26 DIAGNOSIS — M4726 Other spondylosis with radiculopathy, lumbar region: Secondary | ICD-10-CM | POA: Diagnosis not present

## 2019-02-26 DIAGNOSIS — M5116 Intervertebral disc disorders with radiculopathy, lumbar region: Secondary | ICD-10-CM | POA: Diagnosis not present

## 2019-02-26 DIAGNOSIS — M4727 Other spondylosis with radiculopathy, lumbosacral region: Secondary | ICD-10-CM | POA: Diagnosis not present

## 2019-02-26 DIAGNOSIS — M419 Scoliosis, unspecified: Secondary | ICD-10-CM | POA: Diagnosis not present

## 2019-02-28 DIAGNOSIS — M1612 Unilateral primary osteoarthritis, left hip: Secondary | ICD-10-CM | POA: Diagnosis not present

## 2019-02-28 DIAGNOSIS — M47816 Spondylosis without myelopathy or radiculopathy, lumbar region: Secondary | ICD-10-CM | POA: Diagnosis not present

## 2019-02-28 DIAGNOSIS — M5136 Other intervertebral disc degeneration, lumbar region: Secondary | ICD-10-CM | POA: Diagnosis not present

## 2019-02-28 DIAGNOSIS — M545 Low back pain: Secondary | ICD-10-CM | POA: Diagnosis not present

## 2019-02-28 DIAGNOSIS — I10 Essential (primary) hypertension: Secondary | ICD-10-CM | POA: Diagnosis not present

## 2019-03-12 ENCOUNTER — Ambulatory Visit: Payer: BC Managed Care – PPO | Admitting: Cardiology

## 2019-03-12 ENCOUNTER — Encounter: Payer: Self-pay | Admitting: Cardiology

## 2019-03-12 ENCOUNTER — Other Ambulatory Visit: Payer: Self-pay

## 2019-03-12 VITALS — BP 110/64 | HR 96 | Ht 64.0 in | Wt 234.0 lb

## 2019-03-12 DIAGNOSIS — E785 Hyperlipidemia, unspecified: Secondary | ICD-10-CM

## 2019-03-12 DIAGNOSIS — R0600 Dyspnea, unspecified: Secondary | ICD-10-CM

## 2019-03-12 DIAGNOSIS — M7989 Other specified soft tissue disorders: Secondary | ICD-10-CM | POA: Diagnosis not present

## 2019-03-12 DIAGNOSIS — I4821 Permanent atrial fibrillation: Secondary | ICD-10-CM | POA: Diagnosis not present

## 2019-03-12 DIAGNOSIS — I10 Essential (primary) hypertension: Secondary | ICD-10-CM | POA: Diagnosis not present

## 2019-03-12 NOTE — Progress Notes (Signed)
Cardiology Office Note:    Date:  03/12/2019   ID:  Christina Mason, DOB 02-23-1955, MRN [id number]  PCP:  Chesley Noon, MD  Cardiologist:  Jenne Campus, MD    Referring MD: Chesley Noon, MD   No chief complaint on file. I have a hip problem  History of Present Illness:    Christina Mason is a 64 y.o. female with past medical history significant for permanent atrial fibrillation, essential hypertension, dyslipidemia, chronic swelling of her lower extremities.  I did echocardiogram on her which showed preserved left ventricle ejection fraction.  Also lately I switch her from hydrochlorothiazide to furosemide to help with the swelling of lower extremities.  However, she was not able to tolerate it because of lot of frequent urination that she had and she simply did not like furosemide.  On top of that she told me that did not help with the swelling of lower extremities.  So, she is back on hydrochlorothiazide.  And she said that her legs do not bother her much.  New problem that evolved since I seen her last time is the fact that she got a lot of pain in her left hip.  Quite extensive evaluation has been done including DVT study which was negative.  She is scheduled to see orthopedic doctor with consideration of potential hip replacement surgery.  However decision has not been made yet. Cardiac wise seems to be doing fine.  Denies have any palpitation, no shortness of breath, chest pain, tightness, pressure, burning in the chest.  Past Medical History:  Diagnosis Date  . Atrial fibrillation ([place])   . Hyperlipidemia   . Hypertension   . Obesity   . PONV (postoperative nausea and vomiting)     Past Surgical History:  Procedure Laterality Date  . CHOLECYSTECTOMY  feb 1997  . COLONOSCOPY WITH PROPOFOL N/A 11/02/2018   Procedure: COLONOSCOPY WITH PROPOFOL;  Surgeon: Juanita Craver, MD;  Location: [facility];  Service: Endoscopy;  Laterality: N/A;  . POLYPECTOMY  11/02/2018   Procedure: POLYPECTOMY;  Surgeon: Juanita Craver, MD;  Location: [facility];  Service: Endoscopy;;  . TOTAL ABDOMINAL HYSTERECTOMY  feb 1997    Current Medications: Current Meds  Medication Sig  . acetaminophen (TYLENOL) 650 MG CR tablet Take 1,300 mg by mouth at bedtime.  . Cholecalciferol (VITAMIN D-3) 125 MCG (5000 UT) TABS Take 5,000 Units by mouth daily.  . Coenzyme Q10 (COQ10) 200 MG CAPS Take 400 mg by mouth daily.  . diclofenac Sodium (VOLTAREN) 1 % GEL Apply topically.  . DULoxetine (CYMBALTA) 20 MG capsule Take 20 mg by mouth daily.  . DULoxetine (CYMBALTA) 60 MG capsule Take 60 mg by mouth daily.   Marland Kitchen ELIQUIS 5 MG TABS tablet TAKE 1 TABLET BY MOUTH TWICE A DAY  . hydrochlorothiazide (HYDRODIURIL) 25 MG tablet Take 1 tablet (25 mg total) by mouth daily.  . metoprolol succinate (TOPROL-XL) 100 MG 24 hr tablet TAKE 1 TABLET IN THE MORNING AND 1/2 TABLET IN THE EVENING  . Multiple Vitamin (MULTIVITAMIN WITH MINERALS) TABS tablet Take 1 tablet by mouth daily. Centrum for Women 50+  . Omega-3 Fatty Acids (FISH OIL) 1200 MG CAPS Take 1,200 mg by mouth 2 (two) times daily.  Marland Kitchen omeprazole (PRILOSEC) 20 MG capsule Take 20 mg by mouth daily.    Marland Kitchen PREMARIN 0.625 MG tablet Take 0.625 mg by mouth daily.  . simvastatin (ZOCOR) 20 MG tablet Take 20 mg by mouth every evening.   Marland Kitchen telmisartan (  MICARDIS) 80 MG tablet TAKE 1 TABLET BY MOUTH EVERY DAY  . traMADol (ULTRAM) 50 MG tablet Take 50 mg by mouth every 6 (six) hours as needed.  . TURMERIC PO Take 2 capsules by mouth daily.     Allergies:   Rosuvastatin and Lisinopril   Social History   Socioeconomic History  . Marital status: Married    Spouse name: Renato  . Number of children: Y  . Years of education: Not on file  . Highest education level: Not on file  Occupational History  . 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 Smoking status: Never Smoker  . Smokeless tobacco: Never Used  Substance and Sexual Activity  . Alcohol  use: Yes    Comment: social  . Drug use: No  . Sexual activity: Not on file  Other Topics Concern  . Not on file  Social History Narrative  . Not on file   Social Determinants of Health   Financial Resource Strain:   . Difficulty of Paying Living Expenses: Not on file  Food Insecurity:   . Worried About Charity fundraiser in the Last Year: Not on file  . Ran Out of Food in the Last Year: Not on file  Transportation Needs:   . Lack of Transportation (Medical): Not on file  . Lack of Transportation (Non-Medical): Not on file  Physical Activity:   . Days of Exercise per Week: Not on file  . Minutes of Exercise per Session: Not on file  Stress:   . Feeling of Stress : Not on file  Social Connections:   . Frequency of Communication with Friends and Family: Not on file  . Frequency of Social Gatherings with Friends and Family: Not on file  . Attends Religious Services: Not on file  . Active Member of Clubs or Organizations: Not on file  . Attends Archivist Meetings: Not on file  . Marital Status: Not on file     Family History: The patient's family history includes Alcoholism in her father; Asthma in her mother; CAD in her mother; Dementia in her mother; Heart disease in her mother; Hyperlipidemia in her sister; Hypertension in her brother and mother; Rheum arthritis in her mother; Stroke in her mother. ROS:   Please see the history of present illness.    All 14 point review of systems negative except as described per history of present illness  EKGs/Labs/Other Studies Reviewed:      Recent Labs: 09/06/2018: NT-Pro BNP 316 09/19/2018: BUN 17; Creatinine, Ser 0.76; Potassium 3.9; Sodium 143  Recent Lipid Panel No results found for: CHOL, TRIG, HDL, CHOLHDL, VLDL, LDLCALC, LDLDIRECT  Physical Exam:    VS:  BP 110/64   Pulse 96   Ht 5\' 4"  (1.626 m)   Wt 234 lb (106.1 kg)   SpO2 97%   BMI 40.17 kg/m     Wt Readings from Last 3 Encounters:  03/12/19 234 lb  (106.1 kg)  11/20/18 235 lb (106.6 kg)  09/06/18 237 lb (107.5 kg)     GEN:  Well nourished, well developed in no acute distress HEENT: Normal NECK: No JVD; No carotid bruits LYMPHATICS: No lymphadenopathy CARDIAC: Irregular irregular, no murmurs, no rubs, no gallops RESPIRATORY:  Clear to auscultation without rales, wheezing or rhonchi  ABDOMEN: Soft, non-tender, non-distended MUSCULOSKELETAL:  No edema; No deformity  SKIN: Warm and dry LOWER EXTREMITIES: no swelling NEUROLOGIC:  Alert and oriented x 3 PSYCHIATRIC:  Normal affect   ASSESSMENT:  1. Permanent atrial fibrillation ([place])   2. Essential hypertension   3. Leg swelling   4. Dyslipidemia    PLAN:    In order of problems listed above:  1. Permanent atrial fibrillation rate control anticoagulated with Eliquis which I will continue. 2. Essential hypertension blood pressure appears to be well controlled continue present management. 3. Leg swelling which is still present.  She does have difficulty wearing elastic stockings because she simply cannot put the stockings on her legs because of hip pain.  I try more aggressive diuresis with limited success.  She is back on hydrochlorothiazide.  Recently she had a DVT study done the left lower extremities, apparently that was negative.  I will continue present management encourage her to keep her leg elevated. 4. Dyslipidemia I will call primary care physician to get her fasting lipid profile. 5. She potentially will require hip surgery if that is the case we may be forced to do stress test on her to rule out any significant ischemia.   Medication Adjustments/Labs and Tests Ordered: Current medicines are reviewed at length with the patient today.  Concerns regarding medicines are outlined above.  No orders of the defined types were placed in this encounter.  Medication changes: No orders of the defined types were placed in this encounter.   Signed, Park Liter, MD,  [facility] 03/12/2019 10:56 AM    [place]

## 2019-03-12 NOTE — Patient Instructions (Signed)
Medication Instructions:  Your physician recommends that you continue on your current medications as directed. Please refer to the Current Medication list given to you today.  *If you need a refill on your cardiac medications before your next appointment, please call your pharmacy*  Lab Work: Your physician recommends that you return for lab work today: pro bnp, bmp   If you have labs (blood work) drawn today and your tests are completely normal, you will receive your results only by: Marland Kitchen MyChart Message (if you have MyChart) OR . A paper copy in the mail If you have any lab test that is abnormal or we need to change your treatment, we will call you to review the results.  Testing/Procedures: None.   Follow-Up: At [facility], you and your health needs are our priority.  As part of our continuing mission to provide you with exceptional heart care, we have created designated Provider Care Teams.  These Care Teams include your primary Cardiologist (physician) and Advanced Practice Providers (APPs -  Physician Assistants and Nurse Practitioners) who all work together to provide you with the care you need, when you need it.  Your next appointment:   3 month(s)  The format for your next appointment:   In Person  Provider:   Jenne Campus, MD  Other Instructions

## 2019-03-13 LAB — BASIC METABOLIC PANEL
BUN/Creatinine Ratio: 17 (ref 12–28)
BUN: 15 mg/dL (ref 8–27)
CO2: 29 mmol/L (ref 20–29)
Calcium: 10.4 mg/dL — ABNORMAL HIGH (ref 8.7–10.3)
Chloride: 100 mmol/L (ref 96–106)
Creatinine, Ser: 0.86 mg/dL (ref 0.57–1.00)
GFR calc Af Amer: 83 mL/min/{1.73_m2} (ref >59)
GFR calc non Af Amer: 72 mL/min/{1.73_m2} (ref >59)
Glucose: 96 mg/dL (ref 65–99)
Potassium: 4.1 mmol/L (ref 3.5–5.2)
Sodium: 144 mmol/L (ref 134–144)

## 2019-03-13 LAB — PRO B NATRIURETIC PEPTIDE: NT-Pro BNP: 316 pg/mL — ABNORMAL HIGH (ref 0–287)

## 2019-03-15 DIAGNOSIS — M1612 Unilateral primary osteoarthritis, left hip: Secondary | ICD-10-CM | POA: Diagnosis not present

## 2019-03-15 DIAGNOSIS — I10 Essential (primary) hypertension: Secondary | ICD-10-CM | POA: Diagnosis not present

## 2019-03-16 DIAGNOSIS — M1612 Unilateral primary osteoarthritis, left hip: Secondary | ICD-10-CM | POA: Diagnosis not present

## 2019-03-20 ENCOUNTER — Other Ambulatory Visit: Payer: Self-pay | Admitting: Cardiology

## 2019-03-23 ENCOUNTER — Ambulatory Visit: Payer: Self-pay

## 2019-03-28 DIAGNOSIS — M1612 Unilateral primary osteoarthritis, left hip: Secondary | ICD-10-CM | POA: Diagnosis not present

## 2019-03-28 DIAGNOSIS — Z79891 Long term (current) use of opiate analgesic: Secondary | ICD-10-CM | POA: Diagnosis not present

## 2019-03-28 DIAGNOSIS — I10 Essential (primary) hypertension: Secondary | ICD-10-CM | POA: Diagnosis not present

## 2019-03-28 DIAGNOSIS — M47816 Spondylosis without myelopathy or radiculopathy, lumbar region: Secondary | ICD-10-CM | POA: Diagnosis not present

## 2019-03-28 DIAGNOSIS — G894 Chronic pain syndrome: Secondary | ICD-10-CM | POA: Diagnosis not present

## 2019-04-05 ENCOUNTER — Ambulatory Visit: Payer: BC Managed Care – PPO | Attending: Internal Medicine

## 2019-04-05 DIAGNOSIS — Z23 Encounter for immunization: Secondary | ICD-10-CM

## 2019-04-05 NOTE — Progress Notes (Signed)
   Covid-19 Vaccination Clinic  Name:  Christina Mason    MRN: [id number] DOB: 10-Apr-1955  04/05/2019  [name] was observed post Covid-19 immunization for 15 minutes without incident. She was provided with Vaccine Information Sheet and instruction to access the V-Safe system.   [name] was instructed to call 911 with any severe reactions post vaccine: Marland Kitchen Difficulty breathing  . Swelling of face and throat  . A fast heartbeat  . A bad rash all over body  . Dizziness and weakness   Immunizations Administered    Name Date Dose VIS Date Route   Pfizer COVID-19 Vaccine 04/05/2019 10:03 AM 0.3 mL 12/29/2018 Intramuscular   Manufacturer: [place]   Lot: [id number]   S[place]: [id number]

## 2019-04-19 DIAGNOSIS — R590 Localized enlarged lymph nodes: Secondary | ICD-10-CM | POA: Diagnosis not present

## 2019-04-26 DIAGNOSIS — I10 Essential (primary) hypertension: Secondary | ICD-10-CM | POA: Diagnosis not present

## 2019-04-26 DIAGNOSIS — M1612 Unilateral primary osteoarthritis, left hip: Secondary | ICD-10-CM | POA: Diagnosis not present

## 2019-04-30 ENCOUNTER — Ambulatory Visit: Payer: BC Managed Care – PPO | Attending: Internal Medicine

## 2019-04-30 DIAGNOSIS — Z23 Encounter for immunization: Secondary | ICD-10-CM

## 2019-04-30 NOTE — Progress Notes (Signed)
   Covid-19 Vaccination Clinic  Name:  LUANNA BLOOMINGDALE    MRN: [id number] DOB: Oct 25, 1955  04/30/2019  Ms. Kussman was observed post Covid-19 immunization for 30 minutes based on pre-vaccination screening without incident. She was provided with Vaccine Information Sheet and instruction to access the V-Safe system.   Ms. Wyland was instructed to call 911 with any severe reactions post vaccine: Marland Kitchen Difficulty breathing  . Swelling of face and throat  . A fast heartbeat  . A bad rash all over body  . Dizziness and weakness   Immunizations Administered    Name Date Dose VIS Date Route   Pfizer COVID-19 Vaccine 04/30/2019 10:00 AM 0.3 mL 12/29/2018 Intramuscular   Manufacturer: [employer]   Lot: [id number]   [place]: [id number]

## 2019-06-01 ENCOUNTER — Encounter: Payer: Self-pay | Admitting: Cardiology

## 2019-06-01 ENCOUNTER — Ambulatory Visit: Payer: BC Managed Care – PPO | Admitting: Cardiology

## 2019-06-01 ENCOUNTER — Other Ambulatory Visit: Payer: Self-pay

## 2019-06-01 VITALS — BP 112/80 | HR 80 | Ht 64.0 in | Wt 225.0 lb

## 2019-06-01 DIAGNOSIS — G4733 Obstructive sleep apnea (adult) (pediatric): Secondary | ICD-10-CM

## 2019-06-01 DIAGNOSIS — E785 Hyperlipidemia, unspecified: Secondary | ICD-10-CM | POA: Diagnosis not present

## 2019-06-01 DIAGNOSIS — I10 Essential (primary) hypertension: Secondary | ICD-10-CM

## 2019-06-01 DIAGNOSIS — I4821 Permanent atrial fibrillation: Secondary | ICD-10-CM

## 2019-06-01 DIAGNOSIS — M7989 Other specified soft tissue disorders: Secondary | ICD-10-CM

## 2019-06-01 NOTE — Patient Instructions (Signed)

## 2019-06-01 NOTE — Progress Notes (Signed)
Cardiology Office Note:    Date:  06/01/2019   ID:  Christina Mason, DOB 11-Oct-1955, MRN [id number]  PCP:  Chesley Noon, MD  Cardiologist:  Jenne Campus, MD    Referring MD: Chesley Noon, MD   Chief Complaint  Patient presents with  . Follow-up  Doing fine  History of Present Illness:    Christina Mason is a 64 y.o. female with permanent atrial fibrillation.  She also has chronic swelling of lower extremities part of it is lymphedema.  She comes today to my office for follow-up.  Overall she is doing well.  Denies any chest pain tightness squeezing pressure burning chest still feels sometimes her heart beating irregular but did stop rare occasions.  Swelling of lower extremities improved somewhat.  She is taking hydrochlorothiazide.  I tried before furosemide however she was not able to tolerate it and her legs did not look any better.  She does have chronic left hip problem she does see orthopedic doctor for with some injection of steroids has been done with minor improvement.  There was some conversation about potential surgery but she is not interested in this at least at this stage.  Past Medical History:  Diagnosis Date  . Atrial fibrillation ([place])   . Dyslipidemia 04/17/2018  . Hiatal hernia 11/08/2013   Dr Collene Mares daily prilosec daily  Last Assessment & Plan:  Relevant Hx: Course: Daily Update: Today's Plan:  . HTN (hypertension) 05/06/2011  . Hyperlipidemia   . Hypertension   . Leg swelling 09/06/2018  . Long term (current) use of anticoagulants 04/17/2018  . Morbid obesity ([place]) 03/30/2018  . Obesity   . Obstructive sleep apnea 10/14/2010   NPSG 2012:  AHI 32/hr  Overview:  Overview:  NPSG 2012:  AHI 32/hr  Last Assessment & Plan:  The patient has moderate obstructive sleep apnea by her recent sleep study, and definitely has an impact on her sleep and quality of life.  I've explained to her that it also may impact her cardiovascular health, especially in light of her  atrial fibrillation.  I have reviewed the various treatment options  . PONV (postoperative nausea and vomiting)   . Post menopausal problems 05/06/2011   Overview:  Dr. Nori Riis OB/GYN They have discussed risk vs benefits of HRT  Last Assessment & Plan:  Relevant Hx: Course: Daily Update: Today's Plan:  . Squamous cell skin cancer 04/05/2018   Dr Ronnald Ramp    Past Surgical History:  Procedure Laterality Date  . CHOLECYSTECTOMY  feb 1997  . COLONOSCOPY WITH PROPOFOL N/A 11/02/2018   Procedure: COLONOSCOPY WITH PROPOFOL;  Surgeon: Juanita Craver, MD;  Location: [facility];  Service: Endoscopy;  Laterality: N/A;  . POLYPECTOMY  11/02/2018   Procedure: POLYPECTOMY;  Surgeon: Juanita Craver, MD;  Location: [facility];  Service: Endoscopy;;  . TOTAL ABDOMINAL HYSTERECTOMY  feb 1997    Current Medications: Current Meds  Medication Sig  . acetaminophen (TYLENOL) 650 MG CR tablet Take 1,300 mg by mouth at bedtime.  . Cholecalciferol (VITAMIN D-3) 125 MCG (5000 UT) TABS Take 5,000 Units by mouth daily.  . Coenzyme Q10 (COQ10) 200 MG CAPS Take 400 mg by mouth daily.  . diclofenac Sodium (VOLTAREN) 1 % GEL Apply topically.  . DULoxetine (CYMBALTA) 20 MG capsule Take 20 mg by mouth daily.  . DULoxetine (CYMBALTA) 60 MG capsule Take 60 mg by mouth daily.   Marland Kitchen ELIQUIS 5 MG TABS tablet TAKE 1 TABLET BY MOUTH TWICE A DAY  .  metoprolol succinate (TOPROL-XL) 100 MG 24 hr tablet TAKE 1 TABLET IN THE MORNING AND 1/2 TABLET IN THE EVENING  . Multiple Vitamin (MULTIVITAMIN WITH MINERALS) TABS tablet Take 1 tablet by mouth daily. Centrum for Women 50+  . Omega-3 Fatty Acids (FISH OIL) 1200 MG CAPS Take 1,200 mg by mouth 2 (two) times daily.  Marland Kitchen omeprazole (PRILOSEC) 20 MG capsule Take 20 mg by mouth daily.    Marland Kitchen PREMARIN 0.625 MG tablet Take 0.625 mg by mouth daily.  . simvastatin (ZOCOR) 20 MG tablet Take 20 mg by mouth every evening.   Marland Kitchen telmisartan (MICARDIS) 80 MG tablet TAKE 1 TABLET BY MOUTH EVERY DAY  .  TURMERIC PO Take 2 capsules by mouth daily.     Allergies:   Rosuvastatin and Lisinopril   Social History   Socioeconomic History  . Marital status: Married    Spouse name: Renato  . Number of children: Y  . Years of education: Not on file  . Highest education level: Not on file  Occupational History  . 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 Smoking status: Never Smoker  . Smokeless tobacco: Never Used  Substance and Sexual Activity  . Alcohol use: Yes    Comment: social  . Drug use: No  . Sexual activity: Not on file  Other Topics Concern  . Not on file  Social History Narrative  . Not on file   Social Determinants of Health   Financial Resource Strain:   . Difficulty of Paying Living Expenses:   Food Insecurity:   . Worried About Charity fundraiser in the Last Year:   . Arboriculturist in the Last Year:   Transportation Needs:   . Film/video editor (Medical):   Marland Kitchen Lack of Transportation (Non-Medical):   Physical Activity:   . Days of Exercise per Week:   . Minutes of Exercise per Session:   Stress:   . Feeling of Stress :   Social Connections:   . Frequency of Communication with Friends and Family:   . Frequency of Social Gatherings with Friends and Family:   . Attends Religious Services:   . Active Member of Clubs or Organizations:   . Attends Archivist Meetings:   Marland Kitchen Marital Status:      Family History: The patient's family history includes Alcoholism in her father; Asthma in her mother; CAD in her mother; Dementia in her mother; Heart disease in her mother; Hyperlipidemia in her sister; Hypertension in her brother and mother; Rheum arthritis in her mother; Stroke in her mother. ROS:   Please see the history of present illness.    All 14 point review of systems negative except as described per history of present illness  EKGs/Labs/Other Studies Reviewed:      Recent Labs: 03/12/2019: BUN 15; Creatinine, Ser 0.86; NT-Pro BNP 316;  Potassium 4.1; Sodium 144  Recent Lipid Panel No results found for: CHOL, TRIG, HDL, CHOLHDL, VLDL, LDLCALC, LDLDIRECT  Physical Exam:    VS:  BP 112/80   Pulse 80   Ht 5\' 4"  (1.626 m)   Wt 225 lb (102.1 kg)   SpO2 95%   BMI 38.62 kg/m     Wt Readings from Last 3 Encounters:  06/01/19 225 lb (102.1 kg)  03/12/19 234 lb (106.1 kg)  11/20/18 235 lb (106.6 kg)     GEN:  Well nourished, well developed in no acute distress HEENT: Normal NECK: No JVD; No carotid bruits LYMPHATICS:  No lymphadenopathy CARDIAC: RRR, no murmurs, no rubs, no gallops RESPIRATORY:  Clear to auscultation without rales, wheezing or rhonchi  ABDOMEN: Soft, non-tender, non-distended MUSCULOSKELETAL:  No edema; No deformity  SKIN: Warm and dry LOWER EXTREMITIES: no swelling NEUROLOGIC:  Alert and oriented x 3 PSYCHIATRIC:  Normal affect   ASSESSMENT:    1. Permanent atrial fibrillation ([place])   2. Dyslipidemia   3. Leg swelling   4. Obstructive sleep apnea   5. Essential hypertension    PLAN:    In order of problems listed above:  1. Permanent atrial fibrillation, she is anticoagulated with Eliquis which I will continue.  Rate is controlled doing well from that point review. 2.  3. Dyslipidemia: I will call primary care physician to get her fasting lipid profile she said recently she had cholesterol checked and apparently was told things are fine she is taking low intensity statin simvastatin 20 mg daily only. 4. Chronic swelling of lower extremities multifactorial she is on small dose of diuretic.  Asked her to keep her legs elevated.  I will also try to refer her to physical therapy for potential massages of her lower extremities. 5. Essential hypertension blood pressure well controlled continue present management. 6. Obstructive sleep apnea will be managed by internal medicine team.  We got results of her fasting lipid profile from primary care physician, total cholesterol 152, HDL 45, LDL 73.   The problem is triglycerides which are 206.  I did talk to her about measures that she can take to improve triglycerides.  That will be exercises on a regular basis as well as management of her weight.  Also asked her to avoid carbohydrates.  She is already taking fish oil.   Medication Adjustments/Labs and Tests Ordered: Current medicines are reviewed at length with the patient today.  Concerns regarding medicines are outlined above.  No orders of the defined types were placed in this encounter.  Medication changes: No orders of the defined types were placed in this encounter.   Signed, Park Liter, MD, [facility] 06/01/2019 10:21 AM    [place]

## 2019-08-21 DIAGNOSIS — R600 Localized edema: Secondary | ICD-10-CM | POA: Diagnosis not present

## 2019-08-21 DIAGNOSIS — I10 Essential (primary) hypertension: Secondary | ICD-10-CM | POA: Diagnosis not present

## 2019-08-21 DIAGNOSIS — M79605 Pain in left leg: Secondary | ICD-10-CM | POA: Diagnosis not present

## 2019-08-21 DIAGNOSIS — M25562 Pain in left knee: Secondary | ICD-10-CM | POA: Diagnosis not present

## 2019-09-15 ENCOUNTER — Other Ambulatory Visit: Payer: Self-pay | Admitting: Cardiology

## 2019-09-18 NOTE — Telephone Encounter (Signed)
Refill sent for Telmisartan and Metoprolol to [facility].

## 2019-10-09 DIAGNOSIS — L814 Other melanin hyperpigmentation: Secondary | ICD-10-CM | POA: Diagnosis not present

## 2019-10-09 DIAGNOSIS — L821 Other seborrheic keratosis: Secondary | ICD-10-CM | POA: Diagnosis not present

## 2019-10-09 DIAGNOSIS — D2261 Melanocytic nevi of right upper limb, including shoulder: Secondary | ICD-10-CM | POA: Diagnosis not present

## 2019-10-09 DIAGNOSIS — D2262 Melanocytic nevi of left upper limb, including shoulder: Secondary | ICD-10-CM | POA: Diagnosis not present

## 2019-10-09 DIAGNOSIS — L918 Other hypertrophic disorders of the skin: Secondary | ICD-10-CM | POA: Diagnosis not present

## 2019-11-21 DIAGNOSIS — H2513 Age-related nuclear cataract, bilateral: Secondary | ICD-10-CM | POA: Diagnosis not present

## 2019-12-04 ENCOUNTER — Other Ambulatory Visit: Payer: Self-pay

## 2019-12-04 DIAGNOSIS — Z9889 Other specified postprocedural states: Secondary | ICD-10-CM | POA: Insufficient documentation

## 2019-12-04 DIAGNOSIS — I10 Essential (primary) hypertension: Secondary | ICD-10-CM | POA: Insufficient documentation

## 2019-12-04 DIAGNOSIS — E669 Obesity, unspecified: Secondary | ICD-10-CM | POA: Insufficient documentation

## 2019-12-05 ENCOUNTER — Other Ambulatory Visit: Payer: Self-pay

## 2019-12-05 ENCOUNTER — Encounter: Payer: Self-pay | Admitting: Cardiology

## 2019-12-05 ENCOUNTER — Ambulatory Visit: Payer: BC Managed Care – PPO | Admitting: Cardiology

## 2019-12-05 VITALS — BP 112/72 | HR 70 | Wt 225.0 lb

## 2019-12-05 DIAGNOSIS — I10 Essential (primary) hypertension: Secondary | ICD-10-CM | POA: Diagnosis not present

## 2019-12-05 DIAGNOSIS — I4821 Permanent atrial fibrillation: Secondary | ICD-10-CM | POA: Diagnosis not present

## 2019-12-05 DIAGNOSIS — E782 Mixed hyperlipidemia: Secondary | ICD-10-CM

## 2019-12-05 NOTE — Progress Notes (Signed)
Cardiology Office Note:    Date:  12/05/2019   ID:  Christina Mason, DOB 1955-03-24, MRN [id number]  PCP:  Chesley Noon, MD  Cardiologist:  Jenne Campus, MD    Referring MD: Chesley Noon, MD   Chief Complaint  Patient presents with  . Follow-up  M doing well  History of Present Illness:    Christina Mason is a 64 y.o. female with past medical history significant for permanent atrial fibrillation, chronic swelling of lower extremities part of a dyslipidemia, essential hypertension, dyslipidemia, obesity, obstructive sleep apnea.  She comes today 2 months of follow-up.  Overall doing very well.  She did sustain some injury to her knee as well as some pain in her left hip that slows her down with exercises as she admits that she does not exercise on the regular basis.  Her lower extremities are swollen as usual.  But she is fine with it.  Denies have any palpitations dizziness passing out chest pain.  Past Medical History:  Diagnosis Date  . Atrial fibrillation ([place])   . Dyslipidemia 04/17/2018  . Hiatal hernia 11/08/2013   Dr Collene Mares daily prilosec daily  Last Assessment & Plan:  Relevant Hx: Course: Daily Update: Today's Plan:  . HTN (hypertension) 05/06/2011  . Hyperlipidemia   . Hypertension   . Leg swelling 09/06/2018  . Long term (current) use of anticoagulants 04/17/2018  . Morbid obesity ([place]) 03/30/2018  . Obesity   . Obstructive sleep apnea 10/14/2010   NPSG 2012:  AHI 32/hr  Overview:  Overview:  NPSG 2012:  AHI 32/hr  Last Assessment & Plan:  The patient has moderate obstructive sleep apnea by her recent sleep study, and definitely has an impact on her sleep and quality of life.  I've explained to her that it also may impact her cardiovascular health, especially in light of her atrial fibrillation.  I have reviewed the various treatment options  . PONV (postoperative nausea and vomiting)   . Post menopausal problems 05/06/2011   Overview:  Dr. Nori Riis [facility] They have  discussed risk vs benefits of HRT  Last Assessment & Plan:  Relevant Hx: Course: Daily Update: Today's Plan:  . Squamous cell skin cancer 04/05/2018   Dr Ronnald Ramp    Past Surgical History:  Procedure Laterality Date  . CHOLECYSTECTOMY  feb 1997  . COLONOSCOPY WITH PROPOFOL N/A 11/02/2018   Procedure: COLONOSCOPY WITH PROPOFOL;  Surgeon: Juanita Craver, MD;  Location: [facility];  Service: Endoscopy;  Laterality: N/A;  . POLYPECTOMY  11/02/2018   Procedure: POLYPECTOMY;  Surgeon: Juanita Craver, MD;  Location: [facility];  Service: Endoscopy;;  . TOTAL ABDOMINAL HYSTERECTOMY  feb 1997    Current Medications: Current Meds  Medication Sig  . acetaminophen (TYLENOL) 650 MG CR tablet Take 1,300 mg by mouth at bedtime.  . Cholecalciferol (VITAMIN D-3) 125 MCG (5000 UT) TABS Take 5,000 Units by mouth daily.  . Coenzyme Q10 (COQ10) 200 MG CAPS Take 400 mg by mouth daily.  . DULoxetine (CYMBALTA) 60 MG capsule Take 60 mg by mouth daily.  Marland Kitchen ELIQUIS 5 MG TABS tablet TAKE 1 TABLET BY MOUTH TWICE A DAY  . hydrochlorothiazide (HYDRODIURIL) 25 MG tablet Take 1 tablet (25 mg total) by mouth daily.  . metoprolol succinate (TOPROL-XL) 100 MG 24 hr tablet TAKE 1 TABLET IN THE MORNING AND 1/2 TABLET IN THE EVENING  . Multiple Vitamin (MULTIVITAMIN WITH MINERALS) TABS tablet Take 1 tablet by mouth daily. Centrum for Women 50+  .  Omega-3 Fatty Acids (FISH OIL) 1200 MG CAPS Take 1,200 mg by mouth 2 (two) times daily.  Marland Kitchen omeprazole (PRILOSEC) 20 MG capsule Take 20 mg by mouth daily.    Marland Kitchen PREMARIN 0.625 MG tablet Take 0.625 mg by mouth daily.  . simvastatin (ZOCOR) 20 MG tablet Take 20 mg by mouth every evening.   Marland Kitchen telmisartan (MICARDIS) 80 MG tablet TAKE 1 TABLET BY MOUTH EVERY DAY  . TURMERIC PO Take 2 capsules by mouth daily.     Allergies:   Rosuvastatin and Lisinopril   Social History   Socioeconomic History  . Marital status: Married    Spouse name: Renato  . Number of children: Y  . Years of  education: Not on file  . Highest education level: Not on file  Occupational History  . 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 Smoking status: Never Smoker  . Smokeless tobacco: Never Used  Vaping Use  . Vaping Use: Never used  Substance and Sexual Activity  . Alcohol use: Yes    Comment: social  . Drug use: No  . Sexual activity: Not on file  Other Topics Concern  . Not on file  Social History Narrative  . Not on file   Social Determinants of Health   Financial Resource Strain:   . Difficulty of Paying Living Expenses: Not on file  Food Insecurity:   . Worried About Charity fundraiser in the Last Year: Not on file  . Ran Out of Food in the Last Year: Not on file  Transportation Needs:   . Lack of Transportation (Medical): Not on file  . Lack of Transportation (Non-Medical): Not on file  Physical Activity:   . Days of Exercise per Week: Not on file  . Minutes of Exercise per Session: Not on file  Stress:   . Feeling of Stress : Not on file  Social Connections:   . Frequency of Communication with Friends and Family: Not on file  . Frequency of Social Gatherings with Friends and Family: Not on file  . Attends Religious Services: Not on file  . Active Member of Clubs or Organizations: Not on file  . Attends Archivist Meetings: Not on file  . Marital Status: Not on file     Family History: The patient's family history includes Alcoholism in her father; Asthma in her mother; CAD in her mother; Dementia in her mother; Heart disease in her mother; Hyperlipidemia in her sister; Hypertension in her brother and mother; Rheum arthritis in her mother; Stroke in her mother. ROS:   Please see the history of present illness.    All 14 point review of systems negative except as described per history of present illness  EKGs/Labs/Other Studies Reviewed:      Recent Labs: 03/12/2019: BUN 15; Creatinine, Ser 0.86; NT-Pro BNP 316; Potassium 4.1; Sodium 144    Recent Lipid Panel No results found for: CHOL, TRIG, HDL, CHOLHDL, VLDL, LDLCALC, LDLDIRECT  Physical Exam:    VS:  BP 112/72 (BP Location: Right Arm, Patient Position: Sitting)   Pulse 70   Wt 225 lb (102.1 kg)   SpO2 95%   BMI 38.62 kg/m     Wt Readings from Last 3 Encounters:  12/05/19 225 lb (102.1 kg)  06/01/19 225 lb (102.1 kg)  03/12/19 234 lb (106.1 kg)     GEN:  Well nourished, well developed in no acute distress HEENT: Normal NECK: No JVD; No carotid bruits LYMPHATICS: No lymphadenopathy CARDIAC:  RRR, no murmurs, no rubs, no gallops RESPIRATORY:  Clear to auscultation without rales, wheezing or rhonchi  ABDOMEN: Soft, non-tender, non-distended MUSCULOSKELETAL:  No edema; No deformity  SKIN: Warm and dry LOWER EXTREMITIES: no swelling NEUROLOGIC:  Alert and oriented x 3 PSYCHIATRIC:  Normal affect   ASSESSMENT:    1. Permanent atrial fibrillation ([place])   2. Primary hypertension   3. Morbid obesity ([place])   4. Mixed hyperlipidemia    PLAN:    In order of problems listed above:  1. Permanent atrial fibrillation: Rate lipid excessive but with her low blood pressure I elected not to push much harder with her rate control.  She is completely asymptomatic, anticoagulation will continue, her chads 2 vascular equals 3. 2. Essential hypertension blood pressure well controlled continue present management. 3. Obesity: Obviously a problem we did talk about need to exercise and she will try to be a little more active. 4. Dyslipidemia: I did review her K PN I do not have l the latest fasting lipid profile, however reviewing the chart from primary care physician revealed cholesterol from 10 months ago with LDL of 73 and HDL of 45.  She is on simvastatin 20 which we will continue.   Medication Adjustments/Labs and Tests Ordered: Current medicines are reviewed at length with the patient today.  Concerns regarding medicines are outlined above.  No orders of the defined types  were placed in this encounter.  Medication changes: No orders of the defined types were placed in this encounter.   Signed, Park Liter, MD, [facility] 12/05/2019 9:44 AM    [place]

## 2019-12-05 NOTE — Patient Instructions (Signed)

## 2019-12-10 DIAGNOSIS — G6289 Other specified polyneuropathies: Secondary | ICD-10-CM | POA: Diagnosis not present

## 2019-12-10 DIAGNOSIS — R600 Localized edema: Secondary | ICD-10-CM | POA: Diagnosis not present

## 2019-12-10 DIAGNOSIS — I10 Essential (primary) hypertension: Secondary | ICD-10-CM | POA: Diagnosis not present

## 2020-02-26 ENCOUNTER — Other Ambulatory Visit: Payer: Self-pay | Admitting: Cardiology

## 2020-04-21 ENCOUNTER — Other Ambulatory Visit: Payer: Self-pay | Admitting: Obstetrics & Gynecology

## 2020-04-21 DIAGNOSIS — R928 Other abnormal and inconclusive findings on diagnostic imaging of breast: Secondary | ICD-10-CM

## 2020-04-22 ENCOUNTER — Other Ambulatory Visit: Payer: Self-pay

## 2020-04-22 ENCOUNTER — Other Ambulatory Visit: Payer: Self-pay | Admitting: Obstetrics & Gynecology

## 2020-04-22 ENCOUNTER — Ambulatory Visit
Admission: RE | Admit: 2020-04-22 | Discharge: 2020-04-22 | Disposition: A | Discharge status: Home / Self Care | Payer: Medicare Other | Source: Ambulatory Visit | Attending: Obstetrics & Gynecology | Admitting: Obstetrics & Gynecology

## 2020-04-22 DIAGNOSIS — R928 Other abnormal and inconclusive findings on diagnostic imaging of breast: Secondary | ICD-10-CM

## 2020-04-24 DIAGNOSIS — Z9889 Other specified postprocedural states: Secondary | ICD-10-CM | POA: Insufficient documentation

## 2020-04-24 DIAGNOSIS — R112 Nausea with vomiting, unspecified: Secondary | ICD-10-CM | POA: Insufficient documentation

## 2020-05-29 ENCOUNTER — Other Ambulatory Visit: Payer: Self-pay

## 2020-05-29 ENCOUNTER — Other Ambulatory Visit: Payer: Self-pay | Admitting: Cardiology

## 2020-05-29 DIAGNOSIS — K589 Irritable bowel syndrome without diarrhea: Secondary | ICD-10-CM

## 2020-05-29 DIAGNOSIS — K625 Hemorrhage of anus and rectum: Secondary | ICD-10-CM

## 2020-05-29 DIAGNOSIS — K219 Gastro-esophageal reflux disease without esophagitis: Secondary | ICD-10-CM

## 2020-05-29 DIAGNOSIS — K5730 Diverticulosis of large intestine without perforation or abscess without bleeding: Secondary | ICD-10-CM | POA: Insufficient documentation

## 2020-05-29 DIAGNOSIS — K648 Other hemorrhoids: Secondary | ICD-10-CM | POA: Insufficient documentation

## 2020-05-29 DIAGNOSIS — Z1211 Encounter for screening for malignant neoplasm of colon: Secondary | ICD-10-CM

## 2020-05-29 DIAGNOSIS — R635 Abnormal weight gain: Secondary | ICD-10-CM

## 2020-05-29 HISTORY — DX: Gastro-esophageal reflux disease without esophagitis: K21.9

## 2020-05-29 HISTORY — DX: Abnormal weight gain: R63.5

## 2020-05-29 HISTORY — DX: Encounter for screening for malignant neoplasm of colon: Z12.11

## 2020-05-29 HISTORY — DX: Diverticulosis of large intestine without perforation or abscess without bleeding: K57.30

## 2020-05-29 HISTORY — DX: Other hemorrhoids: K64.8

## 2020-05-29 HISTORY — DX: Hemorrhage of anus and rectum: K62.5

## 2020-05-29 HISTORY — DX: Irritable bowel syndrome, unspecified: K58.9

## 2020-05-30 ENCOUNTER — Ambulatory Visit: Payer: BC Managed Care – PPO | Admitting: Cardiology

## 2020-06-03 ENCOUNTER — Encounter: Payer: Self-pay | Admitting: Cardiology

## 2020-06-03 ENCOUNTER — Other Ambulatory Visit: Payer: Self-pay

## 2020-06-03 ENCOUNTER — Ambulatory Visit (INDEPENDENT_AMBULATORY_CARE_PROVIDER_SITE_OTHER): Payer: Medicare Other | Admitting: Cardiology

## 2020-06-03 VITALS — BP 130/82 | HR 72 | Ht 63.5 in | Wt 231.0 lb

## 2020-06-03 DIAGNOSIS — I4821 Permanent atrial fibrillation: Secondary | ICD-10-CM | POA: Diagnosis not present

## 2020-06-03 DIAGNOSIS — G4730 Sleep apnea, unspecified: Secondary | ICD-10-CM

## 2020-06-03 DIAGNOSIS — G4733 Obstructive sleep apnea (adult) (pediatric): Secondary | ICD-10-CM

## 2020-06-03 DIAGNOSIS — I10 Essential (primary) hypertension: Secondary | ICD-10-CM | POA: Diagnosis not present

## 2020-06-03 DIAGNOSIS — E785 Hyperlipidemia, unspecified: Secondary | ICD-10-CM | POA: Diagnosis not present

## 2020-06-03 NOTE — Progress Notes (Signed)
Cardiology Office Note:    Date:  06/03/2020   ID:  Christina Mason, DOB Mar 12, 1955, MRN [id number]  PCP:  Chesley Noon, MD  Cardiologist:  Jenne Campus, MD    Referring MD: Chesley Noon, MD   Chief Complaint  Patient presents with  . R ankle swelling     R swelling more than L    History of Present Illness:    Christina Mason is a 65 y.o. female with past medical history significant for permanent atrial fibrillation, she is anticoagulated, obstructive sleep apnea, essential hypertension, dyslipidemia, chronic swelling of lower extremities.  She comes today 2 months of follow-up she does complain of having swelling of lower extremities right is always worse than the left.  She said this may be able be worse than it was before.  She also described the fact that she does have old CPAP mask that she had for more than 10 years probably need to be upgraded.  Denies have any chest pain tightness squeezing pressure burning chest no palpitations no dizziness no passing out.  Past Medical History:  Diagnosis Date  . Abnormal weight gain 05/29/2020  . Atrial fibrillation ([place])   . Colon cancer screening 05/29/2020  . Diverticulosis of colon 05/29/2020  . Dyslipidemia 04/17/2018  . Gastroesophageal reflux disease 05/29/2020  . Hiatal hernia 11/08/2013   Dr Collene Mares daily prilosec daily  Last Assessment & Plan:  Relevant Hx: Course: Daily Update: Today's Plan:  . HTN (hypertension) 05/06/2011  . Hyperlipidemia   . Hypertension   . Internal hemorrhoids 05/29/2020  . Irritable bowel syndrome 05/29/2020  . Leg swelling 09/06/2018  . Long term (current) use of anticoagulants 04/17/2018  . Morbid (severe) obesity due to excess calories ([place]) 03/30/2018  . Morbid obesity ([place]) 03/30/2018  . Obesity   . Obstructive sleep apnea 10/14/2010   NPSG 2012:  AHI 32/hr  Overview:  Overview:  NPSG 2012:  AHI 32/hr  Last Assessment & Plan:  The patient has moderate obstructive sleep apnea by her recent sleep  study, and definitely has an impact on her sleep and quality of life.  I've explained to her that it also may impact her cardiovascular health, especially in light of her atrial fibrillation.  I have reviewed the various treatment options  . PONV (postoperative nausea and vomiting)   . Post menopausal problems 05/06/2011   Overview:  Dr. Nori Riis OB/GY[facility] They have discussed risk vs benefits of HRT  Last Assessment & Plan:  Relevant Hx: Course: Daily Update: Today's Plan:  . Rectal bleeding 05/29/2020  . Squamous cell skin cancer 04/05/2018   Dr Ronnald Ramp    Past Surgical History:  Procedure Laterality Date  . CHOLECYSTECTOMY  feb 1997  . COLONOSCOPY WITH PROPOFOL N/A 11/02/2018   Procedure: COLONOSCOPY WITH PROPOFOL;  Surgeon: Juanita Craver, MD;  Location: [facility];  Service: Endoscopy;  Laterality: N/A;  . POLYPECTOMY  11/02/2018   Procedure: POLYPECTOMY;  Surgeon: Juanita Craver, MD;  Location: [facility];  Service: Endoscopy;;  . TOTAL ABDOMINAL HYSTERECTOMY  feb 1997    Current Medications: Current Meds  Medication Sig  . acetaminophen (TYLENOL) 650 MG CR tablet Take 1,300 mg by mouth at bedtime.  . Cholecalciferol (VITAMIN D-3) 125 MCG (5000 UT) TABS Take 2,000 Units by mouth daily.  . Coenzyme Q10 (COQ10) 200 MG CAPS Take 400 mg by mouth daily.  . DULoxetine (CYMBALTA) 20 MG capsule Take 20 mg by mouth daily after breakfast.  . DULoxetine (CYMBALTA) 60 MG  capsule Take 1 capsule by mouth at bedtime.  Marland Kitchen ELIQUIS 5 MG TABS tablet TAKE 1 TABLET BY MOUTH TWICE A DAY (Patient taking differently: Take 5 mg by mouth 2 (two) times daily.)  . hydrochlorothiazide (HYDRODIURIL) 25 MG tablet TAKE 1 TABLET BY MOUTH EVERY DAY (Patient taking differently: Take 25 mg by mouth daily.)  . metoprolol succinate (TOPROL-XL) 100 MG 24 hr tablet TAKE 1 TABLET IN THE MORNING AND 1/2 TABLET IN THE EVENING (Patient taking differently: Take 100 mg by mouth 2 (two) times daily. 1 TAB AM AND 1/2 TAB PM)  . Multiple  Vitamin (MULTIVITAMIN WITH MINERALS) TABS tablet Take 1 tablet by mouth daily. Centrum for Women 50+ Unknown strength  . Omega-3 Fatty Acids (FISH OIL) 1200 MG CAPS Take 1,200 mg by mouth 2 (two) times daily.  Marland Kitchen omeprazole (PRILOSEC) 20 MG capsule Take 20 mg by mouth daily.  Marland Kitchen PREMARIN 0.625 MG tablet Take 0.625 mg by mouth daily.  . simvastatin (ZOCOR) 20 MG tablet Take 20 mg by mouth every evening.  Marland Kitchen telmisartan (MICARDIS) 80 MG tablet TAKE 1 TABLET BY MOUTH EVERY DAY (Patient taking differently: Take 80 mg by mouth daily.)  . TURMERIC PO Take 2 capsules by mouth daily. Unknown strength     Allergies:   Rosuvastatin and Lisinopril   Social History   Socioeconomic History  . Marital status: Married    Spouse name: Renato  . Number of children: Y  . Years of education: Not on file  . Highest education level: Not on file  Occupational History  . 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 Smoking status: Never Smoker  . Smokeless tobacco: Never Used  Vaping Use  . Vaping Use: Never used  Substance and Sexual Activity  . Alcohol use: Yes    Comment: social  . Drug use: No  . Sexual activity: Not on file  Other Topics Concern  . Not on file  Social History Narrative  . Not on file   Social Determinants of Health   Financial Resource Strain: Not on file  Food Insecurity: Not on file  Transportation Needs: Not on file  Physical Activity: Not on file  Stress: Not on file  Social Connections: Not on file     Family History: The patient's family history includes Alcoholism in her father; Asthma in her mother; CAD in her mother; Dementia in her mother; Heart disease in her mother; Hyperlipidemia in her sister; Hypertension in her brother and mother; Rheum arthritis in her mother; Stroke in her mother. ROS:   Please see the history of present illness.    All 14 point review of systems negative except as described per history of present illness  EKGs/Labs/Other Studies  Reviewed:      Recent Labs: No results found for requested labs within last 8760 hours.  Recent Lipid Panel No results found for: CHOL, TRIG, HDL, CHOLHDL, VLDL, LDLCALC, LDLDIRECT  Physical Exam:    VS:  BP 130/82 (BP Location: Right Arm, Patient Position: Sitting)   Pulse 72   Ht 5' 3.5" (1.613 m)   Wt 231 lb (104.8 kg)   SpO2 97%   BMI 40.28 kg/m     Wt Readings from Last 3 Encounters:  06/03/20 231 lb (104.8 kg)  12/05/19 225 lb (102.1 kg)  06/01/19 225 lb (102.1 kg)     GEN:  Well nourished, well developed in no acute distress HEENT: Normal NECK: No JVD; No carotid bruits LYMPHATICS: No lymphadenopathy CARDIAC: Irregularly  irregular, no murmurs, no rubs, no gallops RESPIRATORY:  Clear to auscultation without rales, wheezing or rhonchi  ABDOMEN: Soft, non-tender, non-distended MUSCULOSKELETAL:  No edema; No deformity  SKIN: Warm and dry LOWER EXTREMITIES: 1+ swelling NEUROLOGIC:  Alert and oriented x 3 PSYCHIATRIC:  Normal affect   ASSESSMENT:    1. Permanent atrial fibrillation ([place])   2. Primary hypertension   3. Dyslipidemia   4. Sleep apnea, unspecified type   5. Obstructive sleep apnea   6. Long term (current) use of anticoagulants    PLAN:    In order of problems listed above:  1. Permanent atrial fibrillation rate controlled she is anticoagulant which I will continue. 2. Swelling of lower extremities which appears to be worse right now.  She is reluctant to take more diuretic because she simply does not want to go to the restroom that often she keep her legs elevated she use elastic stockings I did talk to her about restricting fluid as well as salt intake.  She is thinking about going to vein specialist which probably is a good idea.  I will repeat echocardiogram to make sure that her left ventricle ejection fraction still preserved. 3. Obstructive sleep apnea she will be referred to a sleep specialist her equipment need to be revised sleep apnea need  to be well controlled sleep apnea can contribute to swelling of lower extremities. 4. Long-term use of anticoagulation for her atrial fibrillation will continue. 5. Otherwise she seems to be doing well.   Medication Adjustments/Labs and Tests Ordered: Current medicines are reviewed at length with the patient today.  Concerns regarding medicines are outlined above.  Orders Placed This Encounter  Procedures  . Ambulatory referral to Sleep Studies   Medication changes: No orders of the defined types were placed in this encounter.   Signed, Park Liter, MD, [facility] 06/03/2020 10:52 AM    [place]

## 2020-06-03 NOTE — Patient Instructions (Signed)
Medication Instructions:  Your physician recommends that you continue on your current medications as directed. Please refer to the Current Medication list given to you today.  *If you need a refill on your cardiac medications before your next appointment, please call your pharmacy*   Lab Work: None If you have labs (blood work) drawn today and your tests are completely normal, you will receive your results only by: Marland Kitchen MyChart Message (if you have MyChart) OR . A paper copy in the mail If you have any lab test that is abnormal or we need to change your treatment, we will call you to review the results.   Testing/Procedures: Your physician has requested that you have an echocardiogram. Echocardiography is a painless test that uses sound waves to create images of your heart. It provides your doctor with information about the size and shape of your heart and how well your heart's chambers and valves are working. This procedure takes approximately one hour. There are no restrictions for this procedure.     Follow-Up: At [facility], you and your health needs are our priority.  As part of our continuing mission to provide you with exceptional heart care, we have created designated Provider Care Teams.  These Care Teams include your primary Cardiologist (physician) and Advanced Practice Providers (APPs -  Physician Assistants and Nurse Practitioners) who all work together to provide you with the care you need, when you need it.  We recommend signing up for the patient portal called "MyChart".  Sign up information is provided on this After Visit Summary.  MyChart is used to connect with patients for Virtual Visits (Telemedicine).  Patients are able to view lab/test results, encounter notes, upcoming appointments, etc.  Non-urgent messages can be sent to your provider as well.   To learn more about what you can do with MyChart, go to [email or web address].    Your next appointment:   6  month(s)  The format for your next appointment:   In Person  Provider:   Jenne Campus, MD   Other Instructions

## 2020-06-04 DIAGNOSIS — M8589 Other specified disorders of bone density and structure, multiple sites: Secondary | ICD-10-CM | POA: Insufficient documentation

## 2020-06-11 ENCOUNTER — Ambulatory Visit (HOSPITAL_BASED_OUTPATIENT_CLINIC_OR_DEPARTMENT_OTHER)
Admission: RE | Admit: 2020-06-11 | Discharge: 2020-06-11 | Disposition: A | Discharge status: Home / Self Care | Payer: Medicare Other | Source: Ambulatory Visit | Attending: Cardiology | Admitting: Cardiology

## 2020-06-11 ENCOUNTER — Other Ambulatory Visit: Payer: Self-pay

## 2020-06-11 DIAGNOSIS — E785 Hyperlipidemia, unspecified: Secondary | ICD-10-CM | POA: Diagnosis present

## 2020-06-11 DIAGNOSIS — G4733 Obstructive sleep apnea (adult) (pediatric): Secondary | ICD-10-CM | POA: Diagnosis present

## 2020-06-11 DIAGNOSIS — I4821 Permanent atrial fibrillation: Secondary | ICD-10-CM | POA: Diagnosis present

## 2020-06-11 DIAGNOSIS — G4730 Sleep apnea, unspecified: Secondary | ICD-10-CM | POA: Diagnosis present

## 2020-06-11 DIAGNOSIS — I10 Essential (primary) hypertension: Secondary | ICD-10-CM | POA: Diagnosis present

## 2020-06-11 MED ORDER — PERFLUTREN LIPID MICROSPHERE
1.0000 mL | INTRAVENOUS | Status: AC | PRN
  Administered 2020-06-11: 2 mL via INTRAVENOUS

## 2020-06-17 LAB — ECHOCARDIOGRAM COMPLETE
AR max vel: 2.11 cm2
AV Area VTI: 1.82 cm2
AV Area mean vel: 1.82 cm2
AV Mean grad: 4 mmHg
AV Peak grad: 7.4 mmHg
Ao pk vel: 1.36 m/s
Area-P 1/2: 4.41 cm2
S' Lateral: 2.34 cm

## 2020-07-08 ENCOUNTER — Other Ambulatory Visit: Payer: Self-pay | Admitting: Cardiology

## 2020-07-08 DIAGNOSIS — G4733 Obstructive sleep apnea (adult) (pediatric): Secondary | ICD-10-CM

## 2020-07-08 DIAGNOSIS — I4821 Permanent atrial fibrillation: Secondary | ICD-10-CM

## 2020-07-08 DIAGNOSIS — I10 Essential (primary) hypertension: Secondary | ICD-10-CM

## 2020-08-18 ENCOUNTER — Ambulatory Visit (HOSPITAL_BASED_OUTPATIENT_CLINIC_OR_DEPARTMENT_OTHER): Payer: Medicare Other | Attending: Cardiology | Admitting: Cardiovascular Disease

## 2020-08-18 ENCOUNTER — Other Ambulatory Visit: Payer: Self-pay

## 2020-08-18 DIAGNOSIS — G4733 Obstructive sleep apnea (adult) (pediatric): Secondary | ICD-10-CM | POA: Diagnosis present

## 2020-08-18 DIAGNOSIS — G4736 Sleep related hypoventilation in conditions classified elsewhere: Secondary | ICD-10-CM | POA: Insufficient documentation

## 2020-08-18 DIAGNOSIS — R0902 Hypoxemia: Secondary | ICD-10-CM | POA: Insufficient documentation

## 2020-08-18 DIAGNOSIS — I10 Essential (primary) hypertension: Secondary | ICD-10-CM | POA: Diagnosis not present

## 2020-08-18 DIAGNOSIS — I4821 Permanent atrial fibrillation: Secondary | ICD-10-CM | POA: Diagnosis not present

## 2020-09-04 ENCOUNTER — Encounter (HOSPITAL_BASED_OUTPATIENT_CLINIC_OR_DEPARTMENT_OTHER): Payer: Self-pay | Admitting: Cardiovascular Disease

## 2020-09-04 ENCOUNTER — Other Ambulatory Visit: Payer: Self-pay | Admitting: Cardiovascular Disease

## 2020-09-04 ENCOUNTER — Telehealth: Payer: Self-pay | Admitting: *Deleted

## 2020-09-04 DIAGNOSIS — G4733 Obstructive sleep apnea (adult) (pediatric): Secondary | ICD-10-CM

## 2020-09-04 DIAGNOSIS — G4736 Sleep related hypoventilation in conditions classified elsewhere: Secondary | ICD-10-CM

## 2020-09-04 NOTE — Procedures (Signed)
     Patient Name: Christina Mason, Clinch Date: 08/18/2020 Gender: Female D.O.B: 1955/06/13 Age (years): 87 Referring Provider: Park Liter Height (inches): 10 Interpreting Physician: Shelva Majestic MD, ABSM Weight (lbs): 230 RPSGT: Carolin Coy BMI: 45 MRN: [id number] Neck Size: 15.75  CLINICAL INFORMATION Sleep Study Type: NPSG  Indication for sleep study: Hypertension, Obesity, OSA, Snoring  Epworth Sleepiness Score: 8  SLEEP STUDY TECHNIQUE As per the AASM Manual for the Scoring of Sleep and Associated Events v2.3 (April 2016) with a hypopnea requiring 4% desaturations.  The channels recorded and monitored were frontal, central and occipital EEG, electrooculogram (EOG), submentalis EMG (chin), nasal and oral airflow, thoracic and abdominal wall motion, anterior tibialis EMG, snore microphone, electrocardiogram, and pulse oximetry.  MEDICATIONS Medications self-administered by patient taken the night of the study : DULOXETINE, METOPROLOL, telmisartin, eliquis, SIMVASTATIN, FISH OIL, TURMERIC, VITAMIN D  SLEEP ARCHITECTURE The study was initiated at 11:00:58 PM and ended at 5:04:28 AM.  Sleep onset time was 18.7 minutes and the sleep efficiency was 53.9%%. The total sleep time was 196 minutes.  Stage REM latency was N/A minutes.  The patient spent 50.3%% of the night in stage N1 sleep, 49.7%% in stage N2 sleep, 0.0%% in stage N3 and 0% in REM.  Alpha intrusion was absent.  Supine sleep was 0.82%.  RESPIRATORY PARAMETERS The overall apnea/hypopnea index (AHI) was 79.3 per hour. The respiratory disturbance index (RDI) was 105.9/h. There were 23 total apneas, including 22 obstructive, 1 central and 0 mixed apneas. There were 236 hypopneas and 87 RERAs.  The AHI during Stage REM sleep was N/A per hour.  AHI while supine was 37.3 per hour.  The mean oxygen saturation was 91.4%. The minimum SpO2 during sleep was 76.0%. Time spent < 88% was 25.7  minutes.  Moderate continuous snoring was noted during this study.  CARDIAC DATA The 2 lead EKG demonstrated atrial fibrillation. The mean heart rate was 85.4 beats per minute. Other EKG findings include: PVCs.  LEG MOVEMENT DATA The total PLMS were 0 with a resulting PLMS index of 0.0. Associated arousal with leg movement index was 1.2 .  IMPRESSIONS - Severe obstructive sleep apnea occurred during this study (AHI 79.3/h; RDI 105.9/h). - No significant central sleep apnea occurred during this study (CAI 0.3/h). - Severe oxygen desaturation to a nadir of 76.0%. - The patient snored with moderate snoring volume. - Abnormal sleep architecture with absent slow wave and REM sleep.  - EKG findings include atrial fibrillation, PVCs. - Clinically significant periodic limb movements did not occur during sleep. No significant associated arousals.  DIAGNOSIS - Obstructive Sleep Apnea (G47.33) - Nocturnal Hypoxemia (G47.36)  RECOMMENDATIONS - Recommend an in-lab therapeutic CPAP titration to determine optimal pressure required to alleviate sleep disordered breathing. - Effort should be made to optimize nasal and oropharyngeal patency. - Avoid alcohol, sedatives and other CNS depressants that may worsen sleep apnea and disrupt normal sleep architecture. - Sleep hygiene should be reviewed to assess factors that may improve sleep quality. - Weight management (BMI 39) and regular exercise should be initiated or continued if appropriate.  [Electronically signed] 09/04/2020 12:35 PM  Shelva Majestic MD, [facility], ABSM Diplomate, American Board of Sleep Medicine   NPI: [id number] [place] PH: [phone]   FX: [phone] [place]

## 2020-09-04 NOTE — Telephone Encounter (Signed)
-----   Message from Troy Sine, MD sent at 09/04/2020 12:41 PM EDT ----- Mariann Laster, please notify pt of results and schedule for in-lab CPAP titration study.

## 2020-09-04 NOTE — Telephone Encounter (Signed)
Patient notified of sleep study results and recommendations. She agrees to proceed with titration sleep study. She asked for it to be scheduled after September 6. She will be leaving town. I told her the lab is currently scheduling into October. She states that is "perfect" She also informs me that she is already on CPAP therapy and has been for years and that her machine is "very old".

## 2020-09-23 ENCOUNTER — Telehealth: Payer: Self-pay | Admitting: Cardiology

## 2020-09-23 NOTE — Telephone Encounter (Signed)
Pt c/o Shortness Of Breath: STAT if SOB developed within the last 24 hours or pt is noticeably SOB on the phone  1. Are you currently SOB (can you hear that pt is SOB on the phone)? no  2. How long have you been experiencing SOB? 4 episodes, for 2 weeks  3. Are you SOB when sitting or when up moving around? Walking up hill or stairs  4. Are you currently experiencing any other symptoms? no   Patient states the last 2 weeks she was in [place] and had 4 episodes of SOB. She states when it happened she was walking up the stairs or a hill. She states when it hits she feels like she will pass out but if she stops and rests it passes. She says while traveling there was a lot of walking that she was not accustomed to and she was sweating a lot. She says she drank plenty of what but is not she if her SOB was due to dehydration.

## 2020-09-24 ENCOUNTER — Other Ambulatory Visit: Payer: Self-pay

## 2020-09-24 ENCOUNTER — Ambulatory Visit (INDEPENDENT_AMBULATORY_CARE_PROVIDER_SITE_OTHER): Payer: Medicare Other

## 2020-09-24 DIAGNOSIS — I4821 Permanent atrial fibrillation: Secondary | ICD-10-CM

## 2020-09-24 NOTE — Telephone Encounter (Signed)
Spoke to the patient just now and let her know Dr. Wendy Poet recommendations. She verbalizes understanding and is going to come to the [employer] office tomorrow at 3:30 to get the monitor put on.   I will route this call to the nurse and CMA that will be there tomorrow.

## 2020-09-25 ENCOUNTER — Ambulatory Visit (INDEPENDENT_AMBULATORY_CARE_PROVIDER_SITE_OTHER): Payer: Medicare Other | Admitting: Cardiology

## 2020-09-25 ENCOUNTER — Other Ambulatory Visit: Payer: Self-pay

## 2020-09-25 VITALS — Ht 64.0 in | Wt 227.0 lb

## 2020-09-25 DIAGNOSIS — I4891 Unspecified atrial fibrillation: Secondary | ICD-10-CM

## 2020-09-25 DIAGNOSIS — I4821 Permanent atrial fibrillation: Secondary | ICD-10-CM | POA: Diagnosis not present

## 2020-09-25 NOTE — Progress Notes (Signed)
Zio monitor applied

## 2020-09-29 NOTE — Progress Notes (Signed)
Zio monitor applied

## 2020-10-24 ENCOUNTER — Telehealth: Payer: Self-pay

## 2020-10-24 NOTE — Telephone Encounter (Signed)
-----   Message from Park Liter, MD sent at 10/24/2020 12:59 PM EDT ----- No, it is fine please continue present dose of metoprolol ----- Message ----- From: Darrel Reach, CMA Sent: 10/17/2020  11:21 AM EDT To: Park Liter, MD  Patient notified of results and recommendations, she reports she has been taking 150 mg of Metoprolol Succ. Please advise if needed to change with instructions.  ----- Message ----- From: Park Liter, MD Sent: 10/17/2020  10:38 AM EDT To: Senaida Ores, RN  Monitor show atrial fibrillation but elevated heart rate.  Please increase her metoprolol succinate from 100 mg daily to 125 mg of metoprolol succinate daily

## 2020-10-24 NOTE — Telephone Encounter (Signed)
Patient notified

## 2020-11-02 ENCOUNTER — Ambulatory Visit (HOSPITAL_BASED_OUTPATIENT_CLINIC_OR_DEPARTMENT_OTHER): Payer: Medicare Other | Attending: Cardiovascular Disease | Admitting: Cardiovascular Disease

## 2020-11-02 DIAGNOSIS — G4733 Obstructive sleep apnea (adult) (pediatric): Secondary | ICD-10-CM | POA: Insufficient documentation

## 2020-11-02 DIAGNOSIS — G4736 Sleep related hypoventilation in conditions classified elsewhere: Secondary | ICD-10-CM | POA: Diagnosis not present

## 2020-11-03 ENCOUNTER — Other Ambulatory Visit: Payer: Self-pay

## 2020-11-05 ENCOUNTER — Encounter: Payer: Self-pay | Admitting: Cardiology

## 2020-11-05 ENCOUNTER — Ambulatory Visit (INDEPENDENT_AMBULATORY_CARE_PROVIDER_SITE_OTHER): Payer: Medicare Other | Admitting: Cardiology

## 2020-11-05 ENCOUNTER — Other Ambulatory Visit: Payer: Self-pay

## 2020-11-05 VITALS — BP 110/72 | HR 95 | Ht 63.5 in | Wt 229.0 lb

## 2020-11-05 DIAGNOSIS — I10 Essential (primary) hypertension: Secondary | ICD-10-CM | POA: Diagnosis not present

## 2020-11-05 DIAGNOSIS — E785 Hyperlipidemia, unspecified: Secondary | ICD-10-CM

## 2020-11-05 DIAGNOSIS — I4821 Permanent atrial fibrillation: Secondary | ICD-10-CM

## 2020-11-05 NOTE — Patient Instructions (Signed)

## 2020-11-05 NOTE — Progress Notes (Signed)
Cardiology Office Note:    Date:  11/05/2020   ID:  Christina Mason, DOB [birth year], MRN [id number]  PCP:  Chesley Noon, MD  Cardiologist:  Jenne Campus, MD    Referring MD: Chesley Noon, MD   Chief Complaint  Patient presents with   Shortness of Breath    History of Present Illness:    Christina Mason is a 65 y.o. female with past medical history significant for permanent atrial fibrillation, she is anticoagulated, obstructive sleep apnea, essential hypertension, dyslipidemia, chronic swelling of lower extremities.  She is coming today 2 months of follow-up.  Few months ago she went to [place] she walks a lot she climbs stairs a lot of there which was very unusual level of exercise for her she complained of being tired exhausted and having short of breath while doing this this is something she wants to talk about today.  She did wear a monitor recently which showed some elevated heart rate.  I did offer increasing dose of metoprolol however according to our notes she was taking 100 mg but in practice she was taking 50 mg.  Therefore, dose of medication has been not increased.  Denies having any chest pain tightness squeezing pressure burning chest  Past Medical History:  Diagnosis Date   Abnormal weight gain 05/29/2020   Atrial fibrillation ([place])    Colon cancer screening 05/29/2020   Diverticulosis of colon 05/29/2020   Dyslipidemia 04/17/2018   Gastroesophageal reflux disease 05/29/2020   Hiatal hernia 11/08/2013   Dr Collene Mares daily prilosec daily  Last Assessment & Plan:  Relevant Hx: Course: Daily Update: Today's Plan:   HTN (hypertension) 05/06/2011   Hyperlipidemia    Hypertension    Internal hemorrhoids 05/29/2020   Irritable bowel syndrome 05/29/2020   Leg swelling 09/06/2018   Long term (current) use of anticoagulants 04/17/2018   Morbid (severe) obesity due to excess calories ([place]) 03/30/2018   Morbid obesity ([place]) 03/30/2018   Obesity    Obstructive sleep apnea  10/14/2010   NPSG 2012:  AHI 32/hr  Overview:  Overview:  NPSG 2012:  AHI 32/hr  Last Assessment & Plan:  The patient has moderate obstructive sleep apnea by her recent sleep study, and definitely has an impact on her sleep and quality of life.  I've explained to her that it also may impact her cardiovascular health, especially in light of her atrial fibrillation.  I have reviewed the various treatment options   PONV (postoperative nausea and vomiting)    Post menopausal problems 05/06/2011   Overview:  Dr. Nori Riis [facility] They have discussed risk vs benefits of HRT  Last Assessment & Plan:  Relevant Hx: Course: Daily Update: Today's Plan:   Rectal bleeding 05/29/2020   Squamous cell skin cancer 04/05/2018   Dr Ronnald Ramp    Past Surgical History:  Procedure Laterality Date   CHOLECYSTECTOMY  feb 1997   COLONOSCOPY WITH PROPOFOL N/A 11/02/2018   Procedure: COLONOSCOPY WITH PROPOFOL;  Surgeon: Juanita Craver, MD;  Location: [facility];  Service: Endoscopy;  Laterality: N/A;   POLYPECTOMY  11/02/2018   Procedure: POLYPECTOMY;  Surgeon: Juanita Craver, MD;  Location: [facility];  Service: Endoscopy;;   TOTAL ABDOMINAL HYSTERECTOMY  feb 1997    Current Medications: Current Meds  Medication Sig   acetaminophen (TYLENOL) 650 MG CR tablet Take 1,300 mg by mouth at bedtime.   Cholecalciferol (VITAMIN D-3) 125 MCG (5000 UT) TABS Take 2,000 Units by mouth daily.   Coenzyme Q10 (COQ10) 200  MG CAPS Take 400 mg by mouth daily.   DULoxetine (CYMBALTA) 20 MG capsule Take 20 mg by mouth daily after breakfast.   DULoxetine (CYMBALTA) 60 MG capsule Take 1 capsule by mouth at bedtime.   ELIQUIS 5 MG TABS tablet TAKE 1 TABLET BY MOUTH TWICE A DAY   hydrochlorothiazide (HYDRODIURIL) 25 MG tablet TAKE 1 TABLET BY MOUTH EVERY DAY   metoprolol succinate (TOPROL-XL) 100 MG 24 hr tablet TAKE 1 TABLET IN THE MORNING AND 1/2 TABLET IN THE EVENING   Multiple Vitamin (MULTIVITAMIN WITH MINERALS) TABS tablet Take 1 tablet by  mouth daily. Centrum for Women 50+ Unknown strength   Omega-3 Fatty Acids (FISH OIL) 1200 MG CAPS Take 1,200 mg by mouth 2 (two) times daily.   omeprazole (PRILOSEC) 20 MG capsule Take 20 mg by mouth daily.   PREMARIN 0.625 MG tablet Take 0.625 mg by mouth daily.   simvastatin (ZOCOR) 20 MG tablet Take 20 mg by mouth every evening.   telmisartan (MICARDIS) 80 MG tablet TAKE 1 TABLET BY MOUTH EVERY DAY   TURMERIC PO Take 2 capsules by mouth daily. 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  Tobacco Use   Smoking status: Never   Smokeless tobacco: Never  Vaping Use   Vaping Use: Never used  Substance and Sexual Activity   Alcohol use: Yes    Comment: social   Drug use: No   Sexual activity: Not on file  Other Topics Concern   Not on file  Social History Narrative   Not on file   Social Determinants of Health   Financial Resource Strain: Not on file  Food Insecurity: Not on file  Transportation Needs: Not on file  Physical Activity: Not on file  Stress: Not on file  Social Connections: Not on file     Family History: The patient's family history includes Alcoholism in her father; Asthma in her mother; CAD in her mother; Dementia in her mother; Heart disease in her mother; Hyperlipidemia in her sister; Hypertension in her brother and mother; Rheum arthritis in her mother; Stroke in her mother. ROS:   Please see the history of present illness.    All 14 point review of systems negative except as described per history of present illness  EKGs/Labs/Other Studies Reviewed:      Recent Labs: No results found for requested labs within last 8760 hours.  Recent Lipid Panel No results found for: CHOL, TRIG, HDL,  CHOLHDL, VLDL, LDLCALC, LDLDIRECT  Physical Exam:    VS:  BP 110/72 (BP Location: Right Arm, Patient Position: Sitting)   Pulse 95   Ht 5' 3.5" (1.613 m)   Wt 229 lb (103.9 kg)   SpO2 95%   BMI 39.93 kg/m     Wt Readings from Last 3 Encounters:  11/05/20 229 lb (103.9 kg)  11/02/20 230 lb (104.3 kg)  09/25/20 227 lb (103 kg)     GEN:  Well nourished, well developed in no acute distress HEENT: Normal NECK: No JVD; No carotid bruits LYMPHATICS: No lymphadenopathy CARDIAC: Irregular irregular, no murmurs, no rubs, no gallops RESPIRATORY:  Clear to auscultation without rales, wheezing or rhonchi  ABDOMEN: Soft, non-tender, non-distended  MUSCULOSKELETAL:  No edema; No deformity  SKIN: Warm and dry LOWER EXTREMITIES: no swelling NEUROLOGIC:  Alert and oriented x 3 PSYCHIATRIC:  Normal affect   ASSESSMENT:    1. Permanent atrial fibrillation ([place])   2. Primary hypertension   3. Dyslipidemia    PLAN:    In order of problems listed above:  Atrial fibrillation with slight elevated heart rate, average heart rate within 24 hours was 89 I suspect significant portion of this is deconditioning.  I recommend to start exercising on the regular basis.  She does have a Fitbit ask her to stop checking her heart rate on Fitbit and then let me know if it is persistently more than 120 we will continue anticoagulation. Essential hypertension: Blood pressure well controlled continue present management. Dyslipidemia stable followed by antimedicine team   Medication Adjustments/Labs and Tests Ordered: Current medicines are reviewed at length with the patient today.  Concerns regarding medicines are outlined above.  No orders of the defined types were placed in this encounter.  Medication changes: No orders of the defined types were placed in this encounter.   Signed, Park Liter, MD, [facility] 11/05/2020 4:58 PM    [place]

## 2020-11-24 ENCOUNTER — Other Ambulatory Visit: Payer: Self-pay | Admitting: Cardiology

## 2020-11-24 ENCOUNTER — Ambulatory Visit: Payer: Medicare Other | Admitting: Cardiology

## 2020-11-25 ENCOUNTER — Other Ambulatory Visit: Payer: Self-pay | Admitting: Cardiology

## 2020-11-25 NOTE — Telephone Encounter (Signed)
Prescription refill request for Christina Mason received. Indication: Afib  Last office visit: 11/05/20  Christina Mason)  Scr: 0.82 (04/24/20 via PCP)  Age: 65 Weight: 103.9kg  Appropriate dose and refill sent to requested pharmacy.

## 2020-11-28 ENCOUNTER — Encounter (HOSPITAL_BASED_OUTPATIENT_CLINIC_OR_DEPARTMENT_OTHER): Payer: Self-pay | Admitting: Cardiovascular Disease

## 2020-11-28 NOTE — Procedures (Signed)
Patient Name: Christina Mason, Christina Mason Date: 11/02/2020 Gender: Female D.O.B: 11/29/1955 Age (years): 63 Referring Provider: Park Liter Height (inches): 64 Interpreting Physician: Shelva Majestic MD, ABSM Weight (lbs): 230 RPSGT: Baxter Flattery BMI: 40 MRN: [id number] Neck Size: 15.75  CLINICAL INFORMATION The patient is referred for a CPAP titration to treat sleep apnea.  Date of NPSG: 08/18/2020: AHI 79.3/h; RDI 105.9/h; abnent REM sleep; O2 nadir 76%.  SLEEP STUDY TECHNIQUE As per the AASM Manual for the Scoring of Sleep and Associated Events v2.3 (April 2016) with a hypopnea requiring 4% desaturations.  The channels recorded and monitored were frontal, central and occipital EEG, electrooculogram (EOG), submentalis EMG (chin), nasal and oral airflow, thoracic and abdominal wall motion, anterior tibialis EMG, snore microphone, electrocardiogram, and pulse oximetry. Continuous positive airway pressure (CPAP) was initiated at the beginning of the study and titrated to treat sleep-disordered breathing.  MEDICATIONS acetaminophen (TYLENOL) 650 MG CR tablet Cholecalciferol (VITAMIN D-3) 125 MCG (5000 UT) TABS Coenzyme Q10 (COQ10) 200 MG CAPS DULoxetine (CYMBALTA) 20 MG capsule DULoxetine (CYMBALTA) 60 MG capsule ELIQUIS 5 MG TABS tablet hydrochlorothiazide (HYDRODIURIL) 25 MG tablet metoprolol succinate (TOPROL-XL) 100 MG 24 hr tablet Multiple Vitamin (MULTIVITAMIN WITH MINERALS) TABS tablet Omega-3 Fatty Acids (FISH OIL) 1200 MG CAPS omeprazole (PRILOSEC) 20 MG capsule PREMARIN 0.625 MG tablet simvastatin (ZOCOR) 20 MG tablet telmisartan (MICARDIS) 80 MG tablet TURMERIC PO Medications self-administered by patient taken the night of the study : DULOXETINE, METOPROLOL, telmisartin, eliquis, SIMVASTATIN, FISH OIL, TURMERIC, VITAMIN D  TECHNICIAN COMMENTS Comments added by technician: Patient had difficulty initiating sleep. Comments added by scorer: N/A  RESPIRATORY  PARAMETERS Optimal PAP Pressure (cm): 15 AHI at Optimal Pressure (/hr): 0 Overall Minimal O2 (%): 78.0 Supine % at Optimal Pressure (%): 0 Minimal O2 at Optimal Pressure (%): 87.0   SLEEP ARCHITECTURE The study was initiated at 10:16:50 PM and ended at 4:30:24 AM.  Sleep onset time was 8.9 minutes and the sleep efficiency was 70.7%%. The total sleep time was 264 minutes.  The patient spent 6.8%% of the night in stage N1 sleep, 93.2%% in stage N2 sleep, 0.0%% in stage N3 and 0% in REM.Stage REM latency was N/A minutes  Wake after sleep onset was 100.7. Alpha intrusion was absent. Supine sleep was 4.87%.  CARDIAC DATA The 2 lead EKG demonstrated sinus rhythm. The mean heart rate was 88.0 beats per minute. Other EKG findings include: Atrial Fibrillation, PVCs.  LEG MOVEMENT DATA The total Periodic Limb Movements of Sleep (PLMS) were 0. The PLMS index was 0.0. A PLMS index of <15 is considered normal in adults.  IMPRESSIONS - CPAP was initiated at 5 cm and was titrated to optimimal optimal PAP pressure at 15 cm of water (AHI 0, O2 nadir 87). - Severe oxygen desaturations were observed during this titration to a nadir of 78% at 9 cm of water.. - No snoring was audible during this study. - 2-lead EKG demonstrated: Atrial Fibrillation, PVCs - Clinically significant periodic limb movements were not noted during this study. Arousals associated with PLMs were rare.  DIAGNOSIS - Obstructive Sleep Apnea (G47.33) - Nocturnal hypoxemia  RECOMMENDATIONS - Recommend an initial trial of Trial of CPAP Auto therapy with EPR of 3 at 14 - 20 cm of water on 15 cm H2O with heated humidification. A Small size Resmed Full Face Mask AirFit F20 mask was used for the titration. - Effort should be made to optimize nasal and oropharyngea patency. - Avoid alcohol, sedatives and other CNS  depressants that may worsen sleep apnea and disrupt normal sleep architecture. - Sleep hygiene should be reviewed to assess  factors that may improve sleep quality. - Weight management (BMI 40) and regular exercise should be initiated or continued. - Recommend a download in 30 days and sleep clinic evaluation after 4 weeks of therapy.   [Electronically signed] 11/28/2020 12:37 PM  Shelva Majestic MD, [facility], ABSM Diplomate, American Board of Sleep Medicine   NPI: [id number]  [place] PH: [phone]   FX: [phone] [place]

## 2020-12-03 ENCOUNTER — Telehealth: Payer: Self-pay | Admitting: *Deleted

## 2020-12-03 ENCOUNTER — Ambulatory Visit: Payer: Medicare Other | Admitting: Cardiology

## 2020-12-03 NOTE — Telephone Encounter (Signed)
Patient notified CPAP titration sleep study has been completed. Dr Claiborne Billings has placed order for her CPAP machine and the order has been sent to [facility].

## 2021-01-07 ENCOUNTER — Other Ambulatory Visit: Payer: Self-pay

## 2021-01-07 ENCOUNTER — Ambulatory Visit (INDEPENDENT_AMBULATORY_CARE_PROVIDER_SITE_OTHER): Payer: Medicare Other | Admitting: Cardiology

## 2021-01-07 ENCOUNTER — Encounter: Payer: Self-pay | Admitting: Cardiology

## 2021-01-07 VITALS — BP 142/86 | HR 110 | Ht 63.5 in | Wt 230.0 lb

## 2021-01-07 DIAGNOSIS — E782 Mixed hyperlipidemia: Secondary | ICD-10-CM

## 2021-01-07 DIAGNOSIS — I10 Essential (primary) hypertension: Secondary | ICD-10-CM

## 2021-01-07 DIAGNOSIS — I4821 Permanent atrial fibrillation: Secondary | ICD-10-CM | POA: Diagnosis not present

## 2021-01-07 MED ORDER — DIGOXIN 125 MCG PO TABS: 0.1250 mg | ORAL_TABLET | Freq: Every day | ORAL | 3 refills | Status: DC

## 2021-01-07 NOTE — Patient Instructions (Signed)
Medication Instructions:  Your physician has recommended you make the following change in your medication:   Start Digoxin 0.125 mg daily.  *If you need a refill on your cardiac medications before your next appointment, please call your pharmacy*   Lab Work: None ordered If you have labs (blood work) drawn today and your tests are completely normal, you will receive your results only by: [place] (if you have MyChart) OR A paper copy in the mail If you have any lab test that is abnormal or we need to change your treatment, we will call you to review the results.   Testing/Procedures: None ordered   Follow-Up: At [facility], you and your health needs are our priority.  As part of our continuing mission to provide you with exceptional heart care, we have created designated Provider Care Teams.  These Care Teams include your primary Cardiologist (physician) and Advanced Practice Providers (APPs -  Physician Assistants and Nurse Practitioners) who all work together to provide you with the care you need, when you need it.  We recommend signing up for the patient portal called "MyChart".  Sign up information is provided on this After Visit Summary.  MyChart is used to connect with patients for Virtual Visits (Telemedicine).  Patients are able to view lab/test results, encounter notes, upcoming appointments, etc.  Non-urgent messages can be sent to your provider as well.   To learn more about what you can do with MyChart, go to [email or web address].    Your next appointment:   6 month(s)  The format for your next appointment:   In Person  Provider:   Jenne Campus, MD   Other Instructions NA

## 2021-01-07 NOTE — Progress Notes (Signed)
Cardiology Office Note:    Date:  01/07/2021   ID:  Christina Mason, DOB 1955-09-29, MRN [id number]  PCP:  Chesley Noon, MD  Cardiologist:  Jenne Campus, MD    Referring MD: Chesley Noon, MD   Chief Complaint  Patient presents with   Follow-up  I am doing fine  History of Present Illness:    Christina Mason is a 65 y.o. female with past medical history significant for permanent atrial fibrillation, she is anticoagulated, obstructive sleep apnea follow-up by internal medicine team, essential hypertension, dyslipidemia, chronic swelling of lower extremities.  Cardiac wise she seems to be doing well.  She denies have any chest pain, tightness, pressure, burning in the chest she does use elastic stockings which helps with the swelling of lower extremities.  She was seen by vein specialist for it.  Past Medical History:  Diagnosis Date   Abnormal weight gain 05/29/2020   Atrial fibrillation ([place])    Colon cancer screening 05/29/2020   Diverticulosis of colon 05/29/2020   Dyslipidemia 04/17/2018   Gastroesophageal reflux disease 05/29/2020   Hiatal hernia 11/08/2013   Dr Collene Mares daily prilosec daily  Last Assessment & Plan:  Relevant Hx: Course: Daily Update: Today's Plan:   HTN (hypertension) 05/06/2011   Hyperlipidemia    Hypertension    Internal hemorrhoids 05/29/2020   Irritable bowel syndrome 05/29/2020   Leg swelling 09/06/2018   Long term (current) use of anticoagulants 04/17/2018   Morbid (severe) obesity due to excess calories ([place]) 03/30/2018   Morbid obesity ([place]) 03/30/2018   Obesity    Obstructive sleep apnea 10/14/2010   NPSG 2012:  AHI 32/hr  Overview:  Overview:  NPSG 2012:  AHI 32/hr  Last Assessment & Plan:  The patient has moderate obstructive sleep apnea by her recent sleep study, and definitely has an impact on her sleep and quality of life.  I've explained to her that it also may impact her cardiovascular health, especially in light of her atrial fibrillation.  I  have reviewed the various treatment options   PONV (postoperative nausea and vomiting)    Post menopausal problems 05/06/2011   Overview:  Dr. Nori Riis OB/GYN They have discussed risk vs benefits of HRT  Last Assessment & Plan:  Relevant Hx: Course: Daily Update: Today's Plan:   Rectal bleeding 05/29/2020   Squamous cell skin cancer 04/05/2018   Dr Ronnald Ramp    Past Surgical History:  Procedure Laterality Date   CHOLECYSTECTOMY  feb 1997   COLONOSCOPY WITH PROPOFOL N/A 11/02/2018   Procedure: COLONOSCOPY WITH PROPOFOL;  Surgeon: Juanita Craver, MD;  Location: [facility];  Service: Endoscopy;  Laterality: N/A;   POLYPECTOMY  11/02/2018   Procedure: POLYPECTOMY;  Surgeon: Juanita Craver, MD;  Location: [facility];  Service: Endoscopy;;   TOTAL ABDOMINAL HYSTERECTOMY  feb 1997    Current Medications: Current Meds  Medication Sig   acetaminophen (TYLENOL) 650 MG CR tablet Take 1,300 mg by mouth at bedtime.   apixaban (ELIQUIS) 5 MG TABS tablet Take 5 mg by mouth 2 (two) times daily.   Cholecalciferol (VITAMIN D-3) 125 MCG (5000 UT) TABS Take 2,000 Units by mouth daily.   Coenzyme Q10 (COQ10) 200 MG CAPS Take 400 mg by mouth daily.   DULoxetine (CYMBALTA) 20 MG capsule Take 20 mg by mouth daily after breakfast.   DULoxetine (CYMBALTA) 60 MG capsule Take 1 capsule by mouth at bedtime.   hydrochlorothiazide (HYDRODIURIL) 25 MG tablet Take 25 mg by mouth daily.  metoprolol succinate (TOPROL-XL) 25 MG 24 hr tablet Take 25 mg by mouth See admin instructions. 1 tab in the morning and 1/2 tab in the evening   Multiple Vitamin (MULTIVITAMIN WITH MINERALS) TABS tablet Take 1 tablet by mouth daily. Centrum for Women 50+ Unknown strength   Omega-3 Fatty Acids (FISH OIL) 1200 MG CAPS Take 1,200 mg by mouth 2 (two) times daily.   omeprazole (PRILOSEC) 20 MG capsule Take 20 mg by mouth daily.   PREMARIN 0.625 MG tablet Take 0.625 mg by mouth daily.   simvastatin (ZOCOR) 20 MG tablet Take 20 mg by mouth every  evening.   telmisartan (MICARDIS) 80 MG tablet Take 80 mg by mouth daily.   TURMERIC PO Take 2 capsules by mouth daily. 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  Tobacco Use   Smoking status: Never   Smokeless tobacco: Never  Vaping Use   Vaping Use: Never used  Substance and Sexual Activity   Alcohol use: Yes    Comment: social   Drug use: No   Sexual activity: Not on file  Other Topics Concern   Not on file  Social History Narrative   Not on file   Social Determinants of Health   Financial Resource Strain: Not on file  Food Insecurity: Not on file  Transportation Needs: Not on file  Physical Activity: Not on file  Stress: Not on file  Social Connections: Not on file     Family History: The patient's family history includes Alcoholism in her father; Asthma in her mother; CAD in her mother; Dementia in her mother; Heart disease in her mother; Hyperlipidemia in her sister; Hypertension in her brother and mother; Rheum arthritis in her mother; Stroke in her mother. ROS:   Please see the history of present illness.    All 14 point review of systems negative except as described per history of present illness  EKGs/Labs/Other Studies Reviewed:      Recent Labs: No results found for requested labs within last 8760 hours.  Recent Lipid Panel No results found for: CHOL, TRIG, HDL, CHOLHDL, VLDL, LDLCALC, LDLDIRECT  Physical Exam:    VS:  BP (!) 142/86 (BP Location: Left Arm, Patient Position: Sitting)    Pulse (!) 110    Ht 5' 3.5" (1.613 m)    Wt 230 lb (104.3 kg)    SpO2 95%    BMI 40.10 kg/m     Wt Readings from Last 3 Encounters:  01/07/21 230 lb (104.3 kg)  11/05/20 229 lb (103.9 kg)  11/02/20 230  lb (104.3 kg)     GEN:  Well nourished, well developed in no acute distress HEENT: Normal NECK: No JVD; No carotid bruits LYMPHATICS: No lymphadenopathy CARDIAC: Irregularly irregular, no murmurs, no rubs, no gallops RESPIRATORY:  Clear to auscultation without rales, wheezing or rhonchi  ABDOMEN: Soft, non-tender, non-distended MUSCULOSKELETAL:  No edema; No deformity  SKIN: Warm and dry LOWER EXTREMITIES: no swelling NEUROLOGIC:  Alert and oriented x 3 PSYCHIATRIC:  Normal affect   ASSESSMENT:    1. Permanent atrial fibrillation ([place])   2. Primary hypertension   3. Mixed hyperlipidemia    PLAN:    In  order of problems listed above:  Atrial fibrillation was still somewhat uncontrolled rate.  I will give her small dose of digoxin only 0.125 mg daily to see if that can control her heart rate better.  Continue anticoagulation Essential hypertension blood pressure slightly elevated, we will continue monitoring. Mixed dyslipidemia, will call primary care physician to get fasting lipid profile   Medication Adjustments/Labs and Tests Ordered: Current medicines are reviewed at length with the patient today.  Concerns regarding medicines are outlined above.  No orders of the defined types were placed in this encounter.  Medication changes: No orders of the defined types were placed in this encounter.   Signed, Park Liter, MD, [facility] 01/07/2021 3:22 PM    [place]

## 2021-02-19 ENCOUNTER — Encounter: Payer: Self-pay | Admitting: Cardiology

## 2021-02-24 ENCOUNTER — Other Ambulatory Visit: Payer: Self-pay | Admitting: Cardiology

## 2021-04-06 ENCOUNTER — Encounter: Payer: Self-pay | Admitting: Cardiovascular Disease

## 2021-04-06 ENCOUNTER — Other Ambulatory Visit: Payer: Self-pay

## 2021-04-06 ENCOUNTER — Ambulatory Visit (INDEPENDENT_AMBULATORY_CARE_PROVIDER_SITE_OTHER): Payer: Medicare Other | Admitting: Cardiovascular Disease

## 2021-04-06 VITALS — BP 118/76 | HR 92 | Ht 63.5 in | Wt 233.4 lb

## 2021-04-06 DIAGNOSIS — Z7901 Long term (current) use of anticoagulants: Secondary | ICD-10-CM

## 2021-04-06 DIAGNOSIS — G4733 Obstructive sleep apnea (adult) (pediatric): Secondary | ICD-10-CM

## 2021-04-06 DIAGNOSIS — I4821 Permanent atrial fibrillation: Secondary | ICD-10-CM | POA: Diagnosis not present

## 2021-04-06 DIAGNOSIS — I10 Essential (primary) hypertension: Secondary | ICD-10-CM | POA: Diagnosis not present

## 2021-04-06 DIAGNOSIS — I872 Venous insufficiency (chronic) (peripheral): Secondary | ICD-10-CM

## 2021-04-06 DIAGNOSIS — E782 Mixed hyperlipidemia: Secondary | ICD-10-CM

## 2021-04-06 MED ORDER — METOPROLOL SUCCINATE ER 100 MG PO TB24: ORAL_TABLET | ORAL | Status: DC

## 2021-04-06 NOTE — Patient Instructions (Signed)
Medication Instructions:  ?The current medical regimen is effective;  continue present plan and medications as directed. Please refer to the Current Medication list given to you today. ? ?*If you need a refill on your cardiac medications before your next appointment, please call your pharmacy* ? ?Lab Work:   Testing/Procedures:  ?NONE    NONE ? ?Follow-Up: ?Your next appointment:  12 month(s) In Person with DR Claiborne Billings FOR SLEEP ? ?Please call our office 2 months in advance to schedule this appointment  ? ?At [facility], you and your health needs are our priority.  As part of our continuing mission to provide you with exceptional heart care, we have created designated Provider Care Teams.  These Care Teams include your primary Cardiologist (physician) and Advanced Practice Providers (APPs -  Physician Assistants and Nurse Practitioners) who all work together to provide you with the care you need, when you need it. ? ? ?

## 2021-04-12 ENCOUNTER — Encounter: Payer: Self-pay | Admitting: Cardiovascular Disease

## 2021-04-12 NOTE — Progress Notes (Signed)
? ?Cardiology Office Note   ? ?Date:  04/12/2021  ? ?ID:  Christina Mason, DOB September 08, 1955, MRN [id number] ? ?PCP:  Chesley Noon, MD  ?Cardiologist:  Shelva Majestic, MD (sleep); Dr. Agustin Cree ? ?New sleep evaluation ? ? ?History of Present Illness:  ?Christina Mason is a 66 y.o. female who is followed by Dr. Agustin Cree for cardiology care.  She has a history of permanent atrial fibrillation on anticoagulation, remote stroke in 2012, hypertension, hyperlipidemia, and sleep apnea. ? ?Apparently, she had remotely been diagnosed with sleep apnea over 10 years ago and had been on therapy with an old [place] equipment.  Recently, Ms. Dancel was referred for a diagnostic polysomnogram by Dr. Agustin Cree which was done on August 18, 2020 and demonstrated severe sleep apnea with an AHI of 79.3/h, RDI of 105.9/h with absent rem sleep and O2 nadir at 76%.  On November 02, 2020 she underwent an in lab CPAP titration and was titrated up to 15 cm water pressure with an AHI of 0 and O2 nadir at 87%.  She again was noted to have severe oxygen desaturation during the titration to a nadir of 78% at 9 cm of water.  She has persistent atrial fibrillation.  ? ?She underwent CPAP set up on January 09, 2021 and has a CPAP AirSense 11 AutoSet unit which is set at a minimum pressure of 14 and maximum pressure of 28 cm of water.  [employer] is her DME company.  A download was obtained in the office today from February 16 through April 03, 2021.  This shows 100% usage.  Average use is 6 hours and 50 minutes.  AHI is excellent at 1.0 and her 95th percentile pressure is 16.6 with a maximum average pressure of 17.5.  Presently, she is unaware of any breakthrough snoring.  She denies any residual daytime sleepiness and an Epworth Sleepiness Scale score was calculated in the office today and this endorsed at 8.  She presents for her initial evaluation with me. ? ? ?Past Medical History:  ?Diagnosis Date  ? Abnormal weight gain 05/29/2020  ? Atrial  fibrillation ([place])   ? Colon cancer screening 05/29/2020  ? Diverticulosis of colon 05/29/2020  ? Dyslipidemia 04/17/2018  ? Gastroesophageal reflux disease 05/29/2020  ? Hiatal hernia 11/08/2013  ? Dr Collene Mares daily prilosec daily  Last Assessment & Plan:  Relevant Hx: Course: Daily Update: Today's Plan:  ? HTN (hypertension) 05/06/2011  ? Hyperlipidemia   ? Hypertension   ? Internal hemorrhoids 05/29/2020  ? Irritable bowel syndrome 05/29/2020  ? Leg swelling 09/06/2018  ? Long term (current) use of anticoagulants 04/17/2018  ? Morbid (severe) obesity due to excess calories ([place]) 03/30/2018  ? Morbid obesity ([place]) 03/30/2018  ? Obesity   ? Obstructive sleep apnea 10/14/2010  ? NPSG 2012:  AHI 32/hr  Overview:  Overview:  NPSG 2012:  AHI 32/hr  Last Assessment & Plan:  The patient has moderate obstructive sleep apnea by her recent sleep study, and definitely has an impact on her sleep and quality of life.  I've explained to her that it also may impact her cardiovascular health, especially in light of her atrial fibrillation.  I have reviewed the various treatment options  ? PONV (postoperative nausea and vomiting)   ? Post menopausal problems 05/06/2011  ? Overview:  Dr. Nori Riis OB/GY[facility] They have discussed risk vs benefits of HRT  Last Assessment & Plan:  Relevant Hx: Course: Daily Update: Today's Plan:  ? Rectal bleeding  05/29/2020  ? Squamous cell skin cancer 04/05/2018  ? Dr Ronnald Ramp  ? ? ?Past Surgical History:  ?Procedure Laterality Date  ? CHOLECYSTECTOMY  feb 1997  ? COLONOSCOPY WITH PROPOFOL N/A 11/02/2018  ? Procedure: COLONOSCOPY WITH PROPOFOL;  Surgeon: Juanita Craver, MD;  Location: [facility];  Service: Endoscopy;  Laterality: N/A;  ? POLYPECTOMY  11/02/2018  ? Procedure: POLYPECTOMY;  Surgeon: Juanita Craver, MD;  Location: [facility];  Service: Endoscopy;;  ? TOTAL ABDOMINAL HYSTERECTOMY  feb 1997  ? ? ?Current Medications: ?Outpatient Medications Prior to Visit  ?Medication Sig Dispense Refill  ? acetaminophen (TYLENOL) 650  MG CR tablet Take 1,300 mg by mouth at bedtime.    ? apixaban (ELIQUIS) 5 MG TABS tablet Take 5 mg by mouth 2 (two) times daily.    ? Cholecalciferol (VITAMIN D-3) 125 MCG (5000 UT) TABS Take 2,000 Units by mouth daily.    ? Coenzyme Q10 (COQ10) 200 MG CAPS Take 400 mg by mouth daily.    ? DULoxetine (CYMBALTA) 20 MG capsule Take 20 mg by mouth daily after breakfast.    ? DULoxetine (CYMBALTA) 60 MG capsule Take 1 capsule by mouth at bedtime.    ? DULoxetine (CYMBALTA) 60 MG capsule Take 1 tablet by mouth daily.    ? hydrochlorothiazide (HYDRODIURIL) 25 MG tablet TAKE 1 TABLET BY MOUTH EVERY DAY 90 tablet 3  ? Multiple Vitamin (MULTIVITAMIN WITH MINERALS) TABS tablet Take 1 tablet by mouth daily. Centrum for Women 50+ Unknown strength    ? Omega-3 Fatty Acids (FISH OIL) 1200 MG CAPS Take 1,200 mg by mouth 2 (two) times daily.    ? omeprazole (PRILOSEC) 20 MG capsule Take 20 mg by mouth daily.    ? PREMARIN 0.625 MG tablet Take 0.625 mg by mouth daily.    ? simvastatin (ZOCOR) 20 MG tablet Take 20 mg by mouth every evening.    ? telmisartan (MICARDIS) 80 MG tablet Take 80 mg by mouth daily.    ? TURMERIC PO Take 2 capsules by mouth daily. Unknown strength    ? metoprolol succinate (TOPROL-XL) 25 MG 24 hr tablet Take 25 mg by mouth See admin instructions. 1 tab in the morning and 1/2 tab in the evening    ? digoxin (LANOXIN) 0.125 MG tablet Take 1 tablet (0.125 mg total) by mouth daily. 90 tablet 3  ? ?No facility-administered medications prior to visit.  ?  ? ?Allergies:   Rosuvastatin and Lisinopril  ? ?Social History  ? ?Socioeconomic History  ? Marital status: Married  ?  Spouse name: Babs Sciara  ? Number of children: Y  ? Years of education: Not on file  ? Highest education level: Not on file  ?Occupational History  ? 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 Smoking status: Never  ? Smokeless tobacco: Never  ?Vaping Use  ? Vaping Use: Never used  ?Substance and Sexual Activity  ? Alcohol use: Yes  ?  Comment:  social  ? Drug use: No  ? Sexual activity: Not on file  ?Other Topics Concern  ? Not on file  ?Social History Narrative  ? Not on file  ? ?Social Determinants of Health  ? ?Financial Resource Strain: Not on file  ?Food Insecurity: Not on file  ?Transportation Needs: Not on file  ?Physical Activity: Not on file  ?Stress: Not on file  ?Social Connections: Not on file  ?  ? ?Family History:  The patient's family history includes Alcoholism in her father; Asthma in  her mother; CAD in her mother; Dementia in her mother; Heart disease in her mother; Hyperlipidemia in her sister; Hypertension in her brother and mother; Rheum arthritis in her mother; Stroke in her mother.  ? ?ROS ?General: Negative; No fevers, chills, or night sweats;  ?HEENT: Negative; No changes in vision or hearing, sinus congestion, difficulty swallowing ?Pulmonary: Negative; No cough, wheezing, shortness of breath, hemoptysis ?Cardiovascular: Permanent atrial fibrillation; hypertension, hyperlipidemia, ?GI: Negative; No nausea, vomiting, diarrhea, or abdominal pain ?GU: Negative; No dysuria, hematuria, or difficulty voiding ?Musculoskeletal: Negative; no myalgias, joint pain, or weakness ?Hematologic/Oncology: Negative; no easy bruising, bleeding ?Endocrine: Negative; no heat/cold intolerance; no diabetes ?Neuro: Remote small stroke 2012; peripheral neuropathy ?Skin: Negative; No rashes or skin lesions ?Psychiatric: Negative; No behavioral problems, depression ?Sleep: Obstructive sleep apnea originally diagnosed over 10 years ago for which she had had treatment with an ResMed S9 Elite CPAP machine. ?Other comprehensive 14 point system review is negative. ? ? ?PHYSICAL EXAM:   ?VS:  BP 118/76   Pulse 92   Ht 5' 3.5" (1.613 m)   Wt 233 lb 6.4 oz (105.9 kg)   SpO2 97%   BMI 40.70 kg/m?    ? ?Repeat blood pressure by me was 112/72 ? ?Wt Readings from Last 3 Encounters:  ?04/06/21 233 lb 6.4 oz (105.9 kg)  ?01/07/21 230 lb (104.3 kg)  ?11/05/20 229 lb  (103.9 kg)  ?  ?General: Alert, oriented, no distress.  ?Skin: normal turgor, no rashes, warm and dry ?HEENT: Normocephalic, atraumatic. Pupils equal round and reactive to light; sclera anicteric; extraocular

## 2021-05-26 ENCOUNTER — Other Ambulatory Visit: Payer: Self-pay | Admitting: Cardiology

## 2021-07-30 ENCOUNTER — Ambulatory Visit (INDEPENDENT_AMBULATORY_CARE_PROVIDER_SITE_OTHER): Payer: Medicare Other | Admitting: Cardiology

## 2021-07-30 ENCOUNTER — Encounter: Payer: Self-pay | Admitting: Cardiology

## 2021-07-30 VITALS — BP 110/68 | HR 68 | Ht 63.5 in | Wt 229.1 lb

## 2021-07-30 DIAGNOSIS — E785 Hyperlipidemia, unspecified: Secondary | ICD-10-CM

## 2021-07-30 DIAGNOSIS — I4821 Permanent atrial fibrillation: Secondary | ICD-10-CM

## 2021-07-30 DIAGNOSIS — K5792 Diverticulitis of intestine, part unspecified, without perforation or abscess without bleeding: Secondary | ICD-10-CM | POA: Insufficient documentation

## 2021-07-30 DIAGNOSIS — K829 Disease of gallbladder, unspecified: Secondary | ICD-10-CM | POA: Insufficient documentation

## 2021-07-30 DIAGNOSIS — I10 Essential (primary) hypertension: Secondary | ICD-10-CM | POA: Diagnosis not present

## 2021-07-30 DIAGNOSIS — M1990 Unspecified osteoarthritis, unspecified site: Secondary | ICD-10-CM | POA: Insufficient documentation

## 2021-07-30 MED ORDER — ATORVASTATIN CALCIUM 20 MG PO TABS: 20.0000 mg | ORAL_TABLET | Freq: Every day | ORAL | 3 refills | Status: DC

## 2021-07-30 NOTE — Patient Instructions (Signed)
Medication Instructions:  Your physician has recommended you make the following change in your medication: STOP: Zocor                    START: Lipitor '20mg'$  1 tablet daily by mouth    Lab Work: Your physician recommends that you return for lab work in: 6 weeks You need to have labs done when you are fasting.  You can come Monday through Friday 8:30 am to 11:30 am and 1:15 to 4:30. Thuirsday and Friday, Morning hours- ONLY- You do not need to make an appointment as the order has already been placed. The labs you are going to have done are Lipids, AST, ALT    Testing/Procedures: None Ordered   Follow-Up: At [facility], you and your health needs are our priority.  As part of our continuing mission to provide you with exceptional heart care, we have created designated Provider Care Teams.  These Care Teams include your primary Cardiologist (physician) and Advanced Practice Providers (APPs -  Physician Assistants and Nurse Practitioners) who all work together to provide you with the care you need, when you need it.  We recommend signing up for the patient portal called "MyChart".  Sign up information is provided on this After Visit Summary.  MyChart is used to connect with patients for Virtual Visits (Telemedicine).  Patients are able to view lab/test results, encounter notes, upcoming appointments, etc.  Non-urgent messages can be sent to your provider as well.   To learn more about what you can do with MyChart, go to [email or web address].    Your next appointment:   6 month(s)  The format for your next appointment:   In Person  Provider:   Jenne Campus, MD    Other Instructions NA

## 2021-07-30 NOTE — Progress Notes (Signed)
Cardiology Office Note:    Date:  07/30/2021   ID:  LOLLIE GUNNER, DOB 22-Sep-1955, MRN [id number]  PCP:  Chesley Noon, MD  Cardiologist:  Jenne Campus, MD    Referring MD: Chesley Noon, MD   Chief Complaint  Patient presents with   Medication Management  Doing well  History of Present Illness:    Christina Mason is a 66 y.o. female with past medical history significant for permanent atrial fibrillation, she is anticoagulated, obstructive sleep apnea follow-up by internal medicine team, essential hypertension, dyslipidemia, chronic swelling of lower extremities.  She comes today 2 months for follow-up.  Overall doing well.  Denies of any chest pain tightness squeezing pressure burning chest.  She tells me that she absolutely hates summer when it so hard she is not doing well overall and had to environment that is why she is not as much active as she was before because she does not like to be outside.  Past Medical History:  Diagnosis Date   Abnormal weight gain 05/29/2020   Atrial fibrillation ([place])    Colon cancer screening 05/29/2020   Diverticulosis of colon 05/29/2020   Dyslipidemia 04/17/2018   Gastroesophageal reflux disease 05/29/2020   Hiatal hernia 11/08/2013   Dr Collene Mares daily prilosec daily  Last Assessment & Plan:  Relevant Hx: Course: Daily Update: Today's Plan:   HTN (hypertension) 05/06/2011   Hyperlipidemia    Hypertension    Internal hemorrhoids 05/29/2020   Irritable bowel syndrome 05/29/2020   Leg swelling 09/06/2018   Long term (current) use of anticoagulants 04/17/2018   Morbid (severe) obesity due to excess calories ([place]) 03/30/2018   Morbid obesity ([place]) 03/30/2018   Obesity    Obstructive sleep apnea 10/14/2010   NPSG 2012:  AHI 32/hr  Overview:  Overview:  NPSG 2012:  AHI 32/hr  Last Assessment & Plan:  The patient has moderate obstructive sleep apnea by her recent sleep study, and definitely has an impact on her sleep and quality of life.  I've  explained to her that it also may impact her cardiovascular health, especially in light of her atrial fibrillation.  I have reviewed the various treatment options   PONV (postoperative nausea and vomiting)    Post menopausal problems 05/06/2011   Overview:  Dr. Nori Riis OB/GYN They have discussed risk vs benefits of HRT  Last Assessment & Plan:  Relevant Hx: Course: Daily Update: Today's Plan:   Rectal bleeding 05/29/2020   Squamous cell skin cancer 04/05/2018   Dr Ronnald Ramp    Past Surgical History:  Procedure Laterality Date   CHOLECYSTECTOMY  feb 1997   COLONOSCOPY WITH PROPOFOL N/A 11/02/2018   Procedure: COLONOSCOPY WITH PROPOFOL;  Surgeon: Juanita Craver, MD;  Location: [facility];  Service: Endoscopy;  Laterality: N/A;   POLYPECTOMY  11/02/2018   Procedure: POLYPECTOMY;  Surgeon: Juanita Craver, MD;  Location: [facility];  Service: Endoscopy;;   TOTAL ABDOMINAL HYSTERECTOMY  feb 1997    Current Medications: Current Meds  Medication Sig   acetaminophen (TYLENOL) 650 MG CR tablet Take 1,300 mg by mouth at bedtime.   apixaban (ELIQUIS) 5 MG TABS tablet Take 5 mg by mouth 2 (two) times daily.   Cholecalciferol (VITAMIN D-3) 125 MCG (5000 UT) TABS Take 2,000 Units by mouth daily.   Coenzyme Q10 (COQ10) 200 MG CAPS Take 400 mg by mouth daily.   DULoxetine (CYMBALTA) 20 MG capsule Take 20 mg by mouth daily after breakfast.   DULoxetine (CYMBALTA) 60 MG capsule  Take 1 capsule by mouth at bedtime.   hydrochlorothiazide (HYDRODIURIL) 25 MG tablet TAKE 1 TABLET BY MOUTH EVERY DAY (Patient taking differently: Take 25 mg by mouth daily.)   metoprolol succinate (TOPROL-XL) 100 MG 24 hr tablet Take 1 tablet (100 mg total) by mouth every morning AND 0.5 tablets (50 mg total) every evening.   Multiple Vitamin (MULTIVITAMIN WITH MINERALS) TABS tablet Take 1 tablet by mouth daily. Centrum for Women 50+ Unknown strength   Omega-3 Fatty Acids (FISH OIL) 1200 MG CAPS Take 1,200 mg by mouth 2 (two) times daily.    omeprazole (PRILOSEC) 20 MG capsule Take 20 mg by mouth daily.   PREMARIN 0.625 MG tablet Take 0.625 mg by mouth daily.   simvastatin (ZOCOR) 20 MG tablet Take 20 mg by mouth every evening.   telmisartan (MICARDIS) 80 MG tablet Take 1 tablet (80 mg total) by mouth daily.   TURMERIC PO Take 2 capsules by mouth daily. Unknown strength   [DISCONTINUED] DULoxetine (CYMBALTA) 60 MG capsule Take 1 tablet by mouth daily.     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  Tobacco Use   Smoking status: Never   Smokeless tobacco: Never  Vaping Use   Vaping Use: Never used  Substance and Sexual Activity   Alcohol use: Yes    Comment: social   Drug use: No   Sexual activity: Not on file  Other Topics Concern   Not on file  Social History Narrative   Not on file   Social Determinants of Health   Financial Resource Strain: Not on file  Food Insecurity: Not on file  Transportation Needs: Not on file  Physical Activity: Not on file  Stress: Not on file  Social Connections: Not on file     Family History: The patient's family history includes Alcoholism in her father; Asthma in her mother; CAD in her mother; Dementia in her mother; Heart disease in her mother; Hyperlipidemia in her sister; Hypertension in her brother and mother; Rheum arthritis in her mother; Stroke in her mother. ROS:   Please see the history of present illness.    All 14 point review of systems negative except as described per history of present illness  EKGs/Labs/Other Studies Reviewed:      Recent Labs: No results found for requested labs within last 365 days.  Recent Lipid Panel No results found for: "CHOL", "TRIG", "HDL", "CHOLHDL", "VLDL", "LDLCALC",  "LDLDIRECT"  Physical Exam:    VS:  BP 110/68 (BP Location: Left Arm, Patient Position: Sitting)   Pulse 68   Ht 5' 3.5" (1.613 m)   Wt 229 lb 1.3 oz (103.9 kg)   SpO2 97%   BMI 39.94 kg/m     Wt Readings from Last 3 Encounters:  07/30/21 229 lb 1.3 oz (103.9 kg)  04/06/21 233 lb 6.4 oz (105.9 kg)  01/07/21 230 lb (104.3 kg)     GEN:  Well nourished, well developed in no acute distress HEENT: Normal NECK: No JVD; No carotid bruits LYMPHATICS: No lymphadenopathy CARDIAC: Irregularly irregular, no murmurs, no rubs, no gallops RESPIRATORY:  Clear to auscultation without rales, wheezing or rhonchi  ABDOMEN: Soft, non-tender, non-distended MUSCULOSKELETAL:  No edema; No deformity  SKIN:  Warm and dry LOWER EXTREMITIES: no swelling NEUROLOGIC:  Alert and oriented x 3 PSYCHIATRIC:  Normal affect   ASSESSMENT:    1. Permanent atrial fibrillation ([place])   2. Primary hypertension   3. Dyslipidemia    PLAN:    In order of problems listed above:  Permanent atrial fibrillation, she is anticoagulant Eliquis which I will continue Essential hypertension blood pressure actually on the lower side continue present management. Dyslipidemia: I did review her lab work test done by primary care physician, her LDL 97 HDL 43 I would suggest to switch Zocor 20 to Lipitor 20 that should get her cholesterol is supposed to be.   Medication Adjustments/Labs and Tests Ordered: Current medicines are reviewed at length with the patient today.  Concerns regarding medicines are outlined above.  No orders of the defined types were placed in this encounter.  Medication changes: No orders of the defined types were placed in this encounter.   Signed, Park Liter, MD, [facility] 07/30/2021 1:50 PM    [facility]

## 2021-09-12 LAB — LIPID PANEL
Chol/HDL Ratio: 3.2 ratio (ref 0.0–4.4)
Cholesterol, Total: 150 mg/dL (ref 100–199)
HDL: 47 mg/dL (ref >39)
LDL Chol Calc (NIH): 68 mg/dL (ref 0–99)
Triglycerides: 211 mg/dL — ABNORMAL HIGH (ref 0–149)
VLDL Cholesterol Cal: 35 mg/dL (ref 5–40)

## 2021-09-12 LAB — AST: AST: 18 IU/L (ref 0–40)

## 2021-09-12 LAB — ALT: ALT: 15 IU/L (ref 0–32)

## 2021-09-15 ENCOUNTER — Encounter: Payer: Self-pay | Admitting: Cardiology

## 2021-09-16 ENCOUNTER — Other Ambulatory Visit: Payer: Self-pay

## 2021-09-16 MED ORDER — SIMVASTATIN 40 MG PO TABS: 40.0000 mg | ORAL_TABLET | Freq: Every day | ORAL | 3 refills | Status: DC

## 2021-09-16 NOTE — Progress Notes (Signed)
Changed from Lipitor to Simvastatin '40mg'$  q day per Dr. Wendy Poet note.

## 2021-09-23 ENCOUNTER — Telehealth: Payer: Self-pay

## 2021-09-23 ENCOUNTER — Telehealth: Payer: Self-pay | Admitting: Cardiology

## 2021-09-23 DIAGNOSIS — E785 Hyperlipidemia, unspecified: Secondary | ICD-10-CM

## 2021-09-23 NOTE — Telephone Encounter (Signed)
Returned patient call and left a message

## 2021-09-23 NOTE — Telephone Encounter (Signed)
Pt returning call regarding lab results. Pease advise

## 2021-09-23 NOTE — Telephone Encounter (Signed)
Results reviewed with pt as per Dr. Krasowski's note.  Pt verbalized understanding and had no additional questions. Routed to PCP  

## 2021-11-27 ENCOUNTER — Other Ambulatory Visit: Payer: Self-pay | Admitting: Cardiology

## 2021-11-28 ENCOUNTER — Other Ambulatory Visit: Payer: Self-pay | Admitting: Cardiology

## 2021-12-01 NOTE — Telephone Encounter (Signed)
Prescription refill request for Eliquis received. Indication:afib Last office visit:7/23 Scr:0.8 Age: 66 Weight:103.9 kg  Prescription refilled

## 2022-02-10 LAB — LIPID PANEL
Chol/HDL Ratio: 4.3 ratio (ref 0.0–4.4)
Cholesterol, Total: 160 mg/dL (ref 100–199)
HDL: 37 mg/dL — ABNORMAL LOW (ref >39)
LDL Chol Calc (NIH): 92 mg/dL (ref 0–99)
Triglycerides: 181 mg/dL — ABNORMAL HIGH (ref 0–149)
VLDL Cholesterol Cal: 31 mg/dL (ref 5–40)

## 2022-02-10 LAB — ALT: ALT: 18 IU/L (ref 0–32)

## 2022-02-10 LAB — AST: AST: 16 IU/L (ref 0–40)

## 2022-02-12 ENCOUNTER — Telehealth: Payer: Self-pay

## 2022-02-12 NOTE — Telephone Encounter (Signed)
Pt is on Simvastatin '40mg'$  - as Per Dr. Wendy Poet note she will try to increase to '80mg'$  daily and call if she has any problems. She will follow up in Feb. At appt.

## 2022-02-23 ENCOUNTER — Ambulatory Visit: Payer: Medicare Other | Attending: Cardiology | Admitting: Cardiology

## 2022-02-23 ENCOUNTER — Encounter: Payer: Self-pay | Admitting: Cardiology

## 2022-02-23 ENCOUNTER — Other Ambulatory Visit: Payer: Self-pay | Admitting: Cardiology

## 2022-02-23 VITALS — BP 110/70 | HR 68 | Ht 63.5 in | Wt 230.0 lb

## 2022-02-23 DIAGNOSIS — I10 Essential (primary) hypertension: Secondary | ICD-10-CM

## 2022-02-23 DIAGNOSIS — Z7901 Long term (current) use of anticoagulants: Secondary | ICD-10-CM | POA: Diagnosis present

## 2022-02-23 DIAGNOSIS — E785 Hyperlipidemia, unspecified: Secondary | ICD-10-CM

## 2022-02-23 DIAGNOSIS — I4821 Permanent atrial fibrillation: Secondary | ICD-10-CM | POA: Diagnosis present

## 2022-02-23 DIAGNOSIS — R0609 Other forms of dyspnea: Secondary | ICD-10-CM

## 2022-02-23 DIAGNOSIS — G4733 Obstructive sleep apnea (adult) (pediatric): Secondary | ICD-10-CM | POA: Diagnosis present

## 2022-02-23 MED ORDER — EZETIMIBE 10 MG PO TABS: 10.0000 mg | ORAL_TABLET | Freq: Every day | ORAL | 3 refills | Status: DC

## 2022-02-23 NOTE — Patient Instructions (Addendum)
Medication Instructions:   Continue:Simvastatin '40mg'$  1 tablet daily  START: Zetia '10mg'$  1 tablet daily   Lab Work: [facility] recommends that you return for lab work in: 6 weeks You need to have labs done when you are fasting.  You can come Monday through Friday 8:00 am to 12:00 pm and 1:00 to 4:00. You do not need to make an appointment as the order has already been placed. The labs you are going to have done are AST, ALT Lipids.    Testing/Procedures: Your physician has requested that you have an echocardiogram. Echocardiography is a painless test that uses sound waves to create images of your heart. It provides your doctor with information about the size and shape of your heart and how well your heart's chambers and valves are working. This procedure takes approximately one hour. There are no restrictions for this procedure. Please do NOT wear cologne, perfume, aftershave, or lotions (deodorant is allowed). Please arrive 15 minutes prior to your appointment time.    Follow-Up: At [facility], you and your health needs are our priority.  As part of our continuing mission to provide you with exceptional heart care, we have created designated Provider Care Teams.  These Care Teams include your primary Cardiologist (physician) and Advanced Practice Providers (APPs -  Physician Assistants and Nurse Practitioners) who all work together to provide you with the care you need, when you need it.  We recommend signing up for the patient portal called "MyChart".  Sign up information is provided on this After Visit Summary.  MyChart is used to connect with patients for Virtual Visits (Telemedicine).  Patients are able to view lab/test results, encounter notes, upcoming appointments, etc.  Non-urgent messages can be sent to your provider as well.   To learn more about what you can do with MyChart, go to [email or web address].    Your next appointment:   6 month(s)  The  format for your next appointment:   In Person  Provider:   Jenne Campus, MD    Other Instructions NA

## 2022-02-23 NOTE — Progress Notes (Unsigned)
Cardiology Office Note:    Date:  02/23/2022   ID:  Christina Mason, DOB April 18, 1955, MRN [id number]  PCP:  Christina Noon, MD  Cardiologist:  Christina Campus, MD    Referring MD: Christina Noon, MD   Chief Complaint  Patient presents with   Medication Management    History of Present Illness:    Christina Mason is a 67 y.o. female with past medical history significant for permanent atrial fibrillation, obstructive sleep apnea, on CPAP mask, dyslipidemia, essential hypertension.  She comes today to my office for follow-up.  Overall she is doing very well.  She denies have any chest pain, tightness, pressure, burning in her chest.  No palpitation dizziness swelling of lower extremities.  She admits that she is not too active.  Past Medical History:  Diagnosis Date   Abnormal weight gain 05/29/2020   Atrial fibrillation ([place])    Colon cancer screening 05/29/2020   Diverticulosis of colon 05/29/2020   Dyslipidemia 04/17/2018   Gastroesophageal reflux disease 05/29/2020   Hiatal hernia 11/08/2013   Dr Collene Mares daily prilosec daily  Last Assessment & Plan:  Relevant Hx: Course: Daily Update: Today's Plan:   HTN (hypertension) 05/06/2011   Hyperlipidemia    Hypertension    Internal hemorrhoids 05/29/2020   Irritable bowel syndrome 05/29/2020   Leg swelling 09/06/2018   Long term (current) use of anticoagulants 04/17/2018   Morbid (severe) obesity due to excess calories ([place]) 03/30/2018   Morbid obesity ([place]) 03/30/2018   Obesity    Obstructive sleep apnea 10/14/2010   NPSG 2012:  AHI 32/hr  Overview:  Overview:  NPSG 2012:  AHI 32/hr  Last Assessment & Plan:  The patient has moderate obstructive sleep apnea by her recent sleep study, and definitely has an impact on her sleep and quality of life.  I've explained to her that it also may impact her cardiovascular health, especially in light of her atrial fibrillation.  I have reviewed the various treatment options   PONV (postoperative nausea and  vomiting)    Post menopausal problems 05/06/2011   Overview:  Dr. Nori Riis [facility] They have discussed risk vs benefits of HRT  Last Assessment & Plan:  Relevant Hx: Course: Daily Update: Today's Plan:   Rectal bleeding 05/29/2020   Squamous cell skin cancer 04/05/2018   Dr Ronnald Ramp    Past Surgical History:  Procedure Laterality Date   CHOLECYSTECTOMY  feb 1997   COLONOSCOPY WITH PROPOFOL N/A 11/02/2018   Procedure: COLONOSCOPY WITH PROPOFOL;  Surgeon: Juanita Craver, MD;  Location: [facility];  Service: Endoscopy;  Laterality: N/A;   POLYPECTOMY  11/02/2018   Procedure: POLYPECTOMY;  Surgeon: Juanita Craver, MD;  Location: [facility];  Service: Endoscopy;;   TOTAL ABDOMINAL HYSTERECTOMY  feb 1997    Current Medications: Current Meds  Medication Sig   acetaminophen (TYLENOL) 650 MG CR tablet Take 1,300 mg by mouth at bedtime.   Cholecalciferol (VITAMIN D-3) 125 MCG (5000 UT) TABS Take 2,000 Units by mouth daily.   Coenzyme Q10 (COQ10) 200 MG CAPS Take 400 mg by mouth daily.   DULoxetine (CYMBALTA) 20 MG capsule Take 20 mg by mouth daily after breakfast.   DULoxetine (CYMBALTA) 60 MG capsule Take 1 capsule by mouth at bedtime.   ELIQUIS 5 MG TABS tablet TAKE 1 TABLET BY MOUTH TWICE A DAY   ezetimibe (ZETIA) 10 MG tablet Take 1 tablet (10 mg total) by mouth daily.   hydrochlorothiazide (HYDRODIURIL) 25 MG tablet Take 1 tablet (25  mg total) by mouth daily.   metoprolol succinate (TOPROL-XL) 100 MG 24 hr tablet Take 1 tablet (100 mg total) by mouth every morning AND 0.5 tablets (50 mg total) every evening.   Multiple Vitamin (MULTIVITAMIN WITH MINERALS) TABS tablet Take 1 tablet by mouth daily. Centrum for Women 50+ Unknown strength   Omega-3 Fatty Acids (FISH OIL) 1200 MG CAPS Take 1,200 mg by mouth 2 (two) times daily.   omeprazole (PRILOSEC) 20 MG capsule Take 20 mg by mouth daily.   simvastatin (ZOCOR) 40 MG tablet Take 1 tablet (40 mg total) by mouth at bedtime.   telmisartan (MICARDIS) 80  MG tablet Take 1 tablet (80 mg total) by mouth daily.   TURMERIC PO Take 2 capsules by mouth daily. 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  Tobacco Use   Smoking status: Never   Smokeless tobacco: Never  Vaping Use   Vaping Use: Never used  Substance and Sexual Activity   Alcohol use: Yes    Comment: social   Drug use: No   Sexual activity: Not on file  Other Topics Concern   Not on file  Social History Narrative   Not on file   Social Determinants of Health   Financial Resource Strain: Not on file  Food Insecurity: Not on file  Transportation Needs: Not on file  Physical Activity: Not on file  Stress: Not on file  Social Connections: Not on file     Family History: The patient's family history includes Alcoholism in her father; Asthma in her mother; CAD in her mother; Dementia in her mother; Heart disease in her mother; Hyperlipidemia in her sister; Hypertension in her brother and mother; Rheum arthritis in her mother; Stroke in her mother. ROS:   Please see the history of present illness.    All 14 point review of systems negative except as described per history of present illness  EKGs/Labs/Other Studies Reviewed:      Recent Labs: 02/09/2022: ALT 18  Recent Lipid Panel    Component Value Date/Time   CHOL 160 02/09/2022 1149   TRIG 181 (H) 02/09/2022 1149   HDL 37 (L) 02/09/2022 1149   CHOLHDL 4.3 02/09/2022 1149   LDLCALC 92 02/09/2022 1149    Physical Exam:    VS:  BP 110/70 (BP Location: Left Arm, Patient Position: Sitting)   Pulse 68   Ht 5' 3.5" (1.613 m)   Wt 230 lb (104.3 kg)   SpO2 94%   BMI 40.10 kg/m     Wt Readings from Last 3 Encounters:  02/23/22 230 lb (104.3 kg)   07/30/21 229 lb 1.3 oz (103.9 kg)  04/06/21 233 lb 6.4 oz (105.9 kg)     GEN:  Well nourished, well developed in no acute distress HEENT: Normal NECK: No JVD; No carotid bruits LYMPHATICS: No lymphadenopathy CARDIAC: RRR, no murmurs, no rubs, no gallops RESPIRATORY:  Clear to auscultation without rales, wheezing or rhonchi  ABDOMEN: Soft, non-tender, non-distended MUSCULOSKELETAL:  No edema; No deformity  SKIN: Warm and dry LOWER EXTREMITIES: no swelling NEUROLOGIC:  Alert and oriented x 3 PSYCHIATRIC:  Normal affect   ASSESSMENT:    1. Dyslipidemia   2.  Dyspnea on exertion   3. Permanent atrial fibrillation ([place])   4. Primary hypertension   5. Obstructive sleep apnea   6. Long term (current) use of anticoagulants    PLAN:    In order of problems listed above:  Dyslipidemia.  She was not able to tolerate 40 mg of Lipitor, she was unable to tolerate higher dose of simvastatin.  Now she is back to 40 mg of Zocor.  I did review K PN which show me her LDL of 92 HDL 37.  Will add Zetia to her medical regimen, fasting lipid profile is TLT will be done in 6 weeks. Dyspnea on exertion as usual.  Denies having issue otherwise he is she admits that she is not too active.  I encouraged her to be more active. Atrial fibrillation which is chronic permanent, she is taking Eliquis which I will continue.  AV blockade is achieved with Toprol-XL. Obstructive sleep apnea she does use CPAP mask on the regular basis and she is very happy and satisfied with   Medication Adjustments/Labs and Tests Ordered: Current medicines are reviewed at length with the patient today.  Concerns regarding medicines are outlined above.  Orders Placed This Encounter  Procedures   ALT   AST   Lipid panel   EKG 12-Lead   ECHOCARDIOGRAM COMPLETE   Medication changes:  Meds ordered this encounter  Medications   ezetimibe (ZETIA) 10 MG tablet    Sig: Take 1 tablet (10 mg total) by mouth daily.    Dispense:   90 tablet    Refill:  3    Signed, Park Liter, MD, [facility] 02/23/2022 9:22 AM    [place] physician for entire appt. Suann Larry. Kenton Kingfisher, RN

## 2022-03-03 ENCOUNTER — Ambulatory Visit (HOSPITAL_BASED_OUTPATIENT_CLINIC_OR_DEPARTMENT_OTHER)
Admission: RE | Admit: 2022-03-03 | Discharge: 2022-03-03 | Disposition: A | Discharge status: Home / Self Care | Payer: Medicare Other | Source: Ambulatory Visit | Attending: Cardiology | Admitting: Cardiology

## 2022-03-03 DIAGNOSIS — R0609 Other forms of dyspnea: Secondary | ICD-10-CM | POA: Diagnosis present

## 2022-03-03 LAB — ECHOCARDIOGRAM COMPLETE
Area-P 1/2: 5.2 cm2
MV M vel: 4.22 m/s
MV Peak grad: 71.2 mmHg
S' Lateral: 3.2 cm

## 2022-05-12 LAB — ALT: ALT: 22 IU/L (ref 0–32)

## 2022-05-12 LAB — LIPID PANEL
Chol/HDL Ratio: 3.1 ratio (ref 0.0–4.4)
Cholesterol, Total: 136 mg/dL (ref 100–199)
HDL: 44 mg/dL (ref >39)
LDL Chol Calc (NIH): 66 mg/dL (ref 0–99)
Triglycerides: 152 mg/dL — ABNORMAL HIGH (ref 0–149)
VLDL Cholesterol Cal: 26 mg/dL (ref 5–40)

## 2022-05-12 LAB — AST: AST: 22 IU/L (ref 0–40)

## 2022-05-14 ENCOUNTER — Telehealth: Payer: Self-pay

## 2022-05-14 NOTE — Telephone Encounter (Signed)
Left message on My Chart with normal results per Dr. Krasowski's note. Routed to PCP. 

## 2022-05-17 ENCOUNTER — Telehealth: Payer: Self-pay

## 2022-05-17 NOTE — Telephone Encounter (Signed)
Pt viewed results in My Chart per Dr. Krasowski's note. Routed to PCP.  

## 2022-05-20 ENCOUNTER — Other Ambulatory Visit: Payer: Self-pay | Admitting: Cardiology

## 2022-05-28 ENCOUNTER — Other Ambulatory Visit: Payer: Self-pay | Admitting: Cardiology

## 2022-08-15 IMAGING — MG MM DIGITAL DIAGNOSTIC UNILAT*L* W/ TOMO W/ CAD
6 series · 6 of 18 positions shown · non-contrast
Comparison: Previous exam(s).

CLINICAL DATA: 64-year-old female for further evaluation of
possible LEFT breast asymmetry on screening mammogram.

EXAM:
DIGITAL DIAGNOSTIC UNILATERAL LEFT MAMMOGRAM WITH TOMOSYNTHESIS AND
CAD
TECHNIQUE: Left digital diagnostic mammography and breast tomosynthesis was
performed. The images were evaluated with computer-aided detection.

[L ML synth-2D]
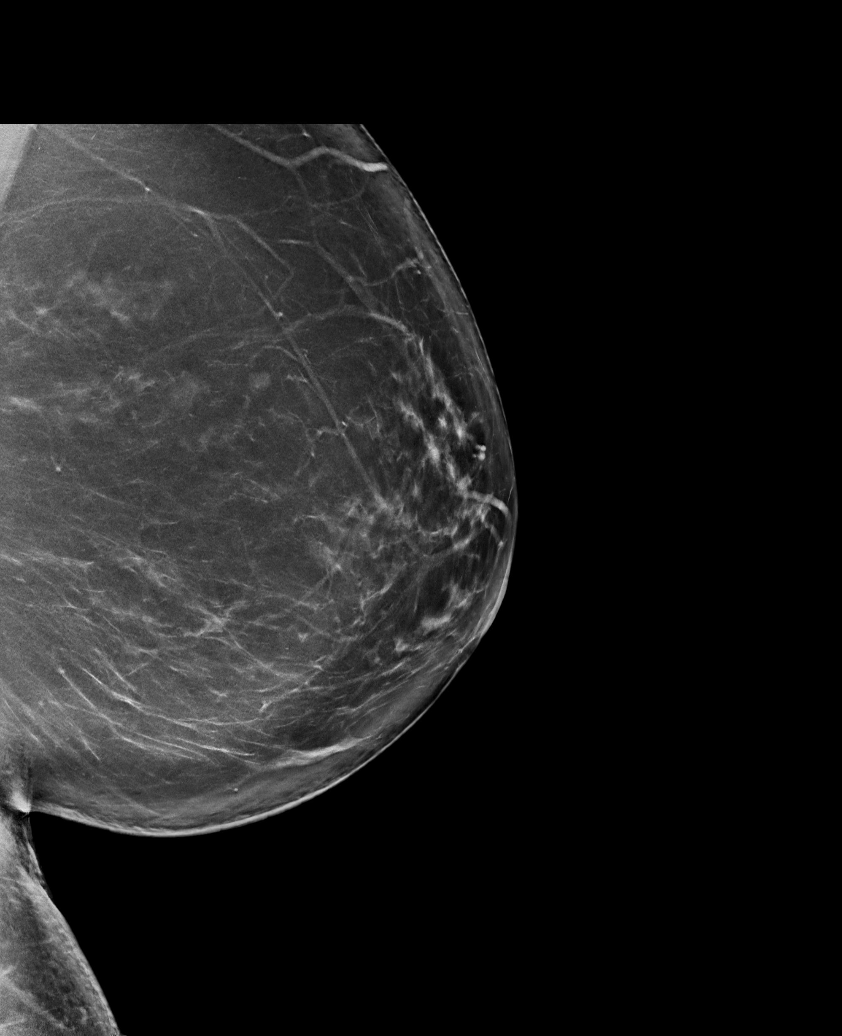

[L CC synth-2D (1 of 2)]
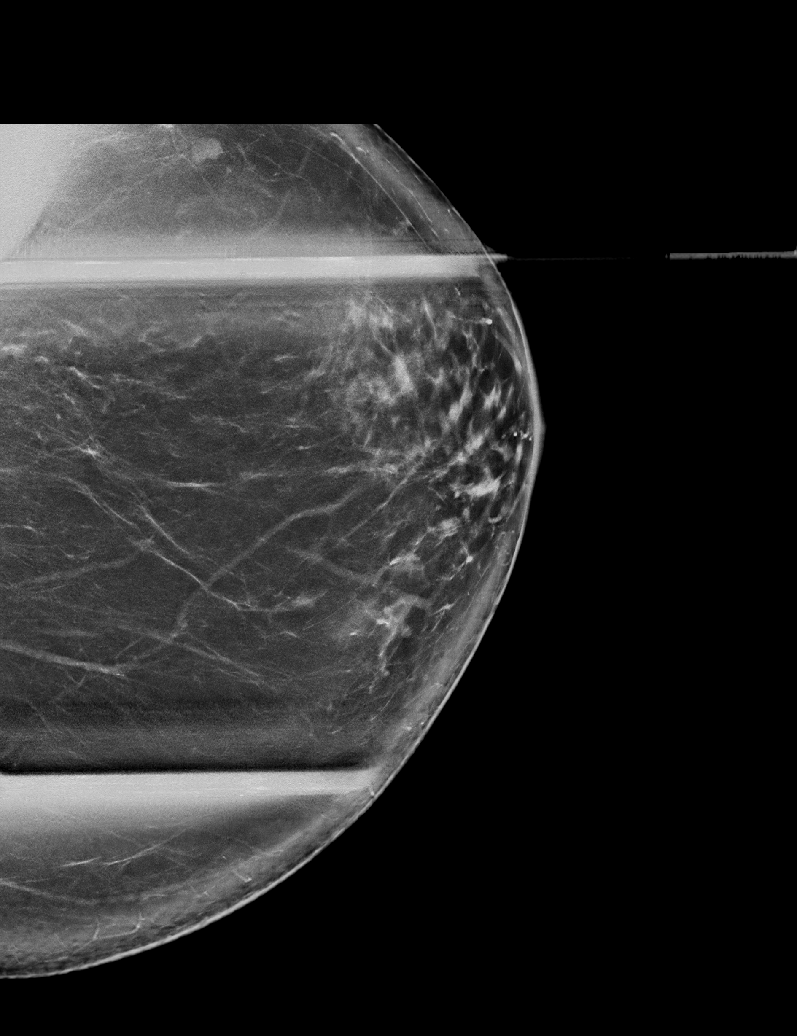

[L CC synth-2D (2 of 2)]
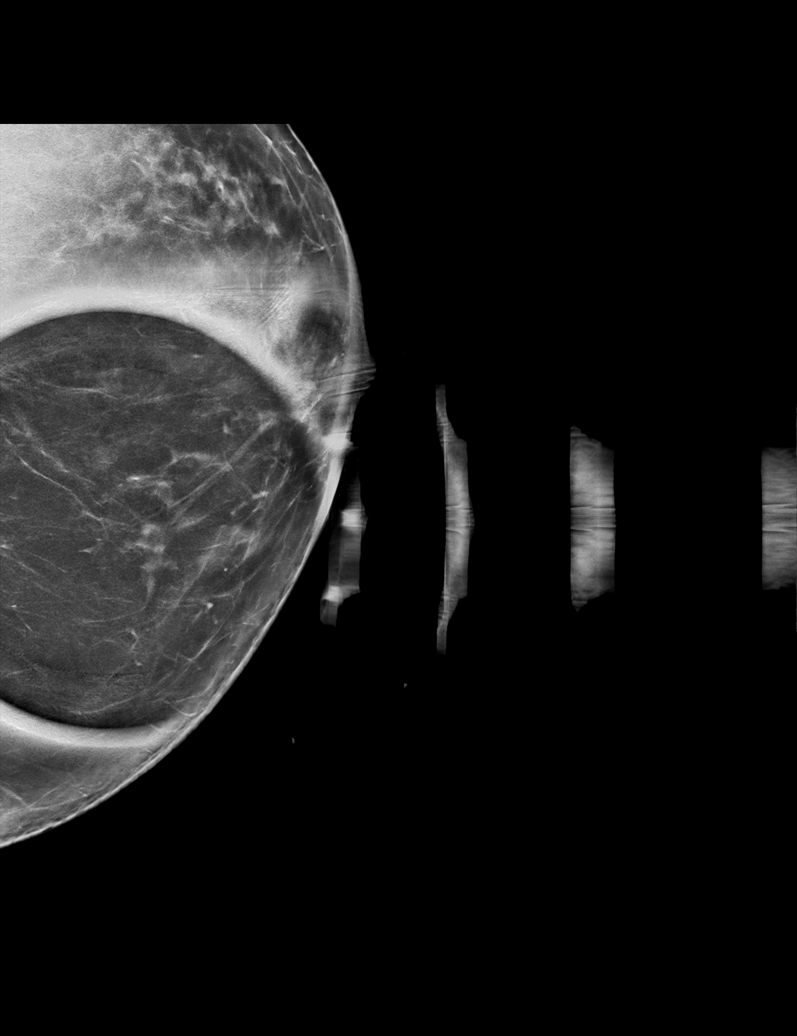

[L ML tomo · tomo slice 51/102.0]
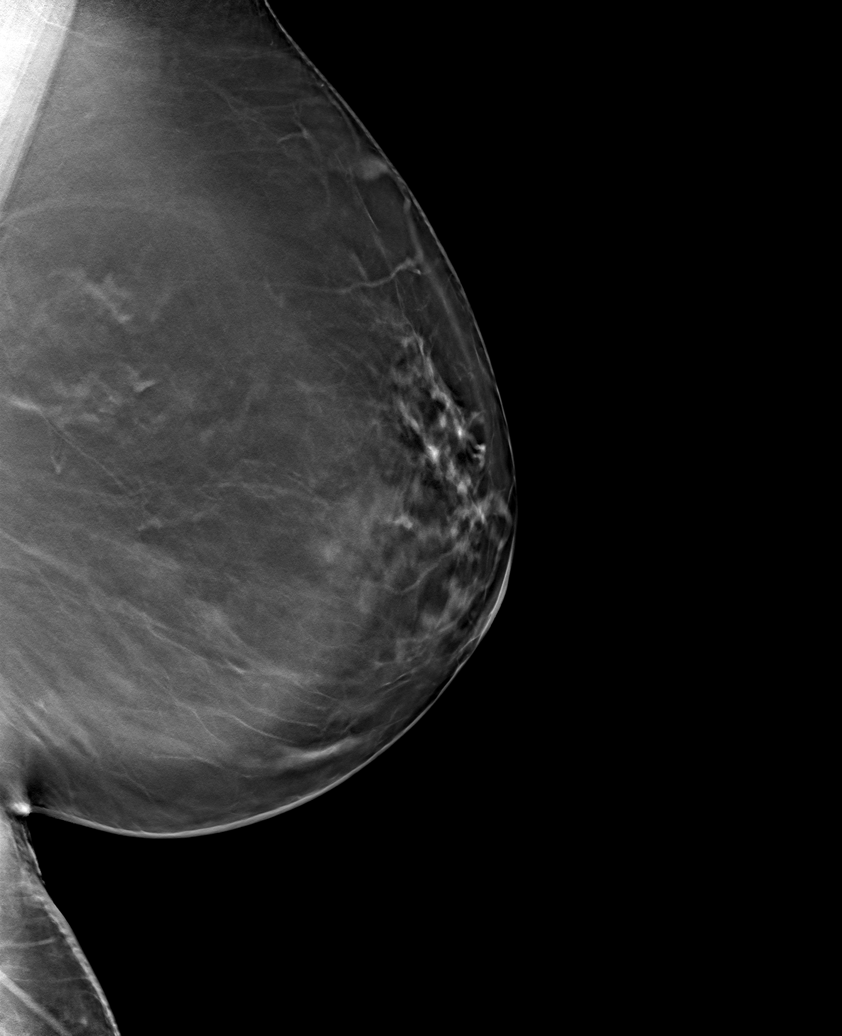

[L CC tomo (1 of 2) · tomo slice 39/76.0]
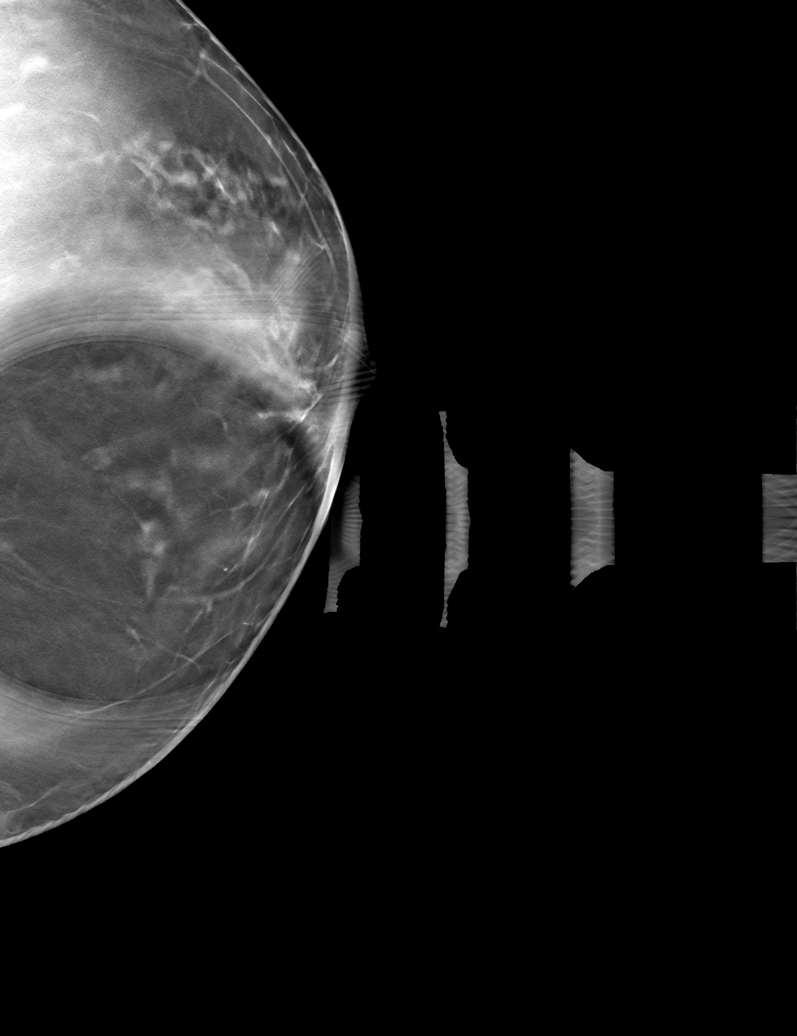

[L CC tomo (2 of 2) · tomo slice 48/95.0]
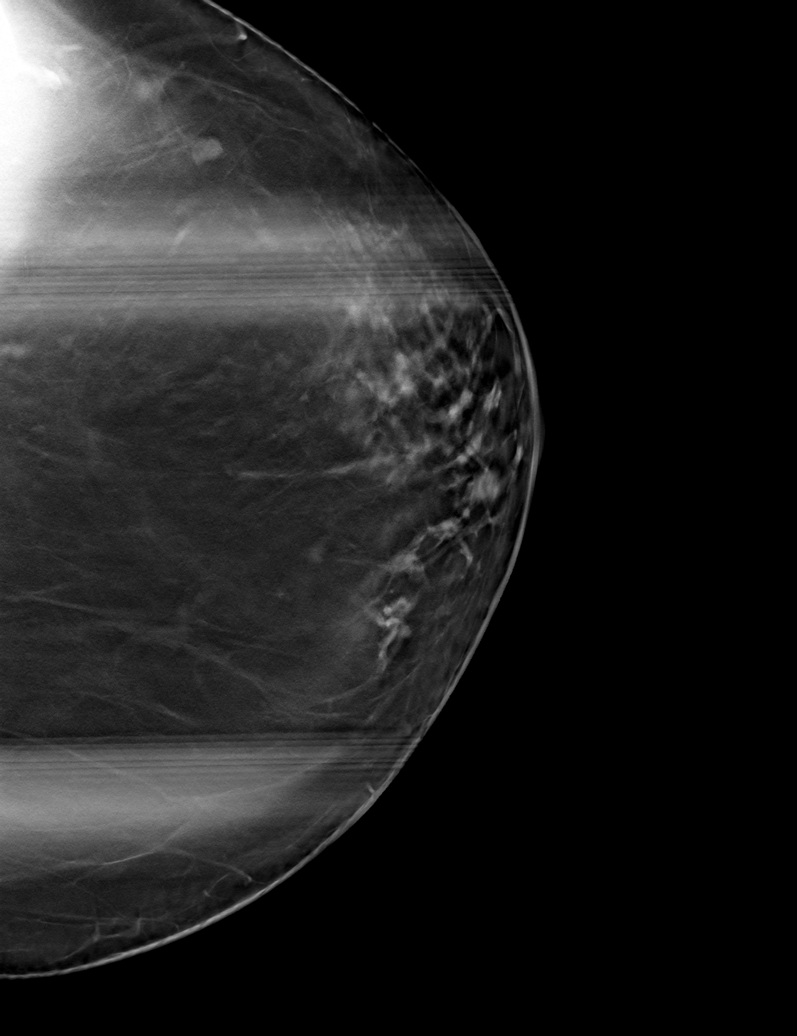

[6 of 18 positions shown; findings below may reference images not displayed]

ACR Breast Density Category b: There are scattered areas of
fibroglandular density.
FINDINGS: 2D/3D full field and spot compression views of the LEFT breast
demonstrate dispersal of the INNER LEFT breast asymmetry without
persistent suspicious abnormality.
IMPRESSION: No persistent suspicious abnormality at the site of the screening
study finding.

RECOMMENDATION:
Bilateral screening mammogram in 1 year.

I have discussed the findings and recommendations with the patient.
If applicable, a reminder letter will be sent to the patient
regarding the next appointment.

BI-RADS CATEGORY  1: Negative.

## 2022-09-05 ENCOUNTER — Other Ambulatory Visit: Payer: Self-pay | Admitting: Cardiology

## 2022-09-18 ENCOUNTER — Encounter: Payer: Self-pay | Admitting: Cardiology

## 2022-09-21 ENCOUNTER — Telehealth: Payer: Self-pay

## 2022-09-21 DIAGNOSIS — E785 Hyperlipidemia, unspecified: Secondary | ICD-10-CM

## 2022-09-21 NOTE — Telephone Encounter (Signed)
Lipid, AST, ALT added per Dr. Bing Matter

## 2022-09-24 LAB — ALT: ALT: 17 IU/L (ref 0–32)

## 2022-09-24 LAB — LIPID PANEL
Chol/HDL Ratio: 3.5 ratio (ref 0.0–4.4)
Cholesterol, Total: 139 mg/dL (ref 100–199)
HDL: 40 mg/dL (ref >39)
LDL Chol Calc (NIH): 75 mg/dL (ref 0–99)
Triglycerides: 138 mg/dL (ref 0–149)
VLDL Cholesterol Cal: 24 mg/dL (ref 5–40)

## 2022-09-24 LAB — AST: AST: 20 IU/L (ref 0–40)

## 2022-09-28 MED ORDER — SIMVASTATIN 40 MG PO TABS: 40.0000 mg | ORAL_TABLET | Freq: Every day | ORAL | 1 refills | Status: DC

## 2022-09-28 MED ORDER — EZETIMIBE 10 MG PO TABS: 10.0000 mg | ORAL_TABLET | Freq: Every day | ORAL | 1 refills | Status: DC

## 2022-09-29 ENCOUNTER — Telehealth: Payer: Self-pay

## 2022-09-29 NOTE — Telephone Encounter (Signed)
-----   Message from Gypsy Balsam sent at 09/29/2022 12:29 PM EDT ----- Cholesterol slightly worse than 4 months ago but still acceptable, continue present management

## 2022-09-29 NOTE — Telephone Encounter (Signed)
Patient notified through my chart.

## 2022-11-24 ENCOUNTER — Ambulatory Visit: Payer: Medicare Other | Admitting: Cardiology

## 2022-12-29 ENCOUNTER — Other Ambulatory Visit: Payer: Self-pay | Admitting: Cardiology

## 2023-01-28 ENCOUNTER — Ambulatory Visit: Payer: Medicare Other | Attending: Cardiology | Admitting: Cardiology

## 2023-01-28 ENCOUNTER — Encounter: Payer: Self-pay | Admitting: Cardiology

## 2023-01-28 ENCOUNTER — Ambulatory Visit: Payer: Medicare Other

## 2023-01-28 VITALS — BP 122/80 | HR 88 | Ht 63.0 in | Wt 230.0 lb

## 2023-01-28 DIAGNOSIS — Z7901 Long term (current) use of anticoagulants: Secondary | ICD-10-CM | POA: Diagnosis present

## 2023-01-28 DIAGNOSIS — E559 Vitamin D deficiency, unspecified: Secondary | ICD-10-CM

## 2023-01-28 DIAGNOSIS — E785 Hyperlipidemia, unspecified: Secondary | ICD-10-CM

## 2023-01-28 DIAGNOSIS — R531 Weakness: Secondary | ICD-10-CM | POA: Diagnosis present

## 2023-01-28 DIAGNOSIS — R5383 Other fatigue: Secondary | ICD-10-CM | POA: Diagnosis present

## 2023-01-28 DIAGNOSIS — I4821 Permanent atrial fibrillation: Secondary | ICD-10-CM

## 2023-01-28 DIAGNOSIS — G4733 Obstructive sleep apnea (adult) (pediatric): Secondary | ICD-10-CM | POA: Diagnosis not present

## 2023-01-28 DIAGNOSIS — R0609 Other forms of dyspnea: Secondary | ICD-10-CM | POA: Diagnosis not present

## 2023-01-28 DIAGNOSIS — I10 Essential (primary) hypertension: Secondary | ICD-10-CM | POA: Diagnosis present

## 2023-01-28 NOTE — Addendum Note (Signed)
 Addended by: Baldo Ash D on: 01/28/2023 10:22 AM   Modules accepted: Orders

## 2023-01-28 NOTE — Patient Instructions (Signed)
 Medication Instructions:  Your physician recommends that you continue on your current medications as directed. Please refer to the Current Medication list given to you today.  *If you need a refill on your cardiac medications before your next appointment, please call your pharmacy*   Lab Work: TSH, CMP, CBC, B12, D3- today If you have labs (blood work) drawn today and your tests are completely normal, you will receive your results only by: MyChart Message (if you have MyChart) OR A paper copy in the mail If you have any lab test that is abnormal or we need to change your treatment, we will call you to review the results.   Testing/Procedures: Your physician has requested that you have an echocardiogram. Echocardiography is a painless test that uses sound waves to create images of your heart. It provides your doctor with information about the size and shape of your heart and how well your heart's chambers and valves are working. This procedure takes approximately one hour. There are no restrictions for this procedure. Please do NOT wear cologne, perfume, aftershave, or lotions (deodorant is allowed). Please arrive 15 minutes prior to your appointment time.  Please note: We ask at that you not bring children with you during ultrasound (echo/ vascular) testing. Due to room size and safety concerns, children are not allowed in the ultrasound rooms during exams. Our front office staff cannot provide observation of children in our lobby area while testing is being conducted. An adult accompanying a patient to their appointment will only be allowed in the ultrasound room at the discretion of the ultrasound technician under special circumstances.    WHY IS MY DOCTOR PRESCRIBING ZIO? The Zio system is proven and trusted by physicians to detect and diagnose irregular heart rhythms -- and has been prescribed to hundreds of thousands of patients.  The FDA has cleared the Zio system to monitor for many  different kinds of irregular heart rhythms. In a study, physicians were able to reach a diagnosis 90% of the time with the Zio system1.  You can wear the Zio monitor -- a small, discreet, comfortable patch -- during your normal day-to-day activity, including while you sleep, shower, and exercise, while it records every single heartbeat for analysis.  1Barrett, P., et al. Comparison of 24 Hour Holter Monitoring Versus 14 Day Novel Adhesive Patch Electrocardiographic Monitoring. American Journal of Medicine, 2014.  ZIO VS. HOLTER MONITORING The Zio monitor can be comfortably worn for up to 14 days. Holter monitors can be worn for 24 to 48 hours, limiting the time to record any irregular heart rhythms you may have. Zio is able to capture data for the 51% of patients who have their first symptom-triggered arrhythmia after 48 hours.1  LIVE WITHOUT RESTRICTIONS The Zio ambulatory cardiac monitor is a small, unobtrusive, and water-resistant patch--you might even forget you're wearing it. The Zio monitor records and stores every beat of your heart, whether you're sleeping, working out, or showering.     Follow-Up: At [facility], you and your health needs are our priority.  As part of our continuing mission to provide you with exceptional heart care, we have created designated Provider Care Teams.  These Care Teams include your primary Cardiologist (physician) and Advanced Practice Providers (APPs -  Physician Assistants and Nurse Practitioners) who all work together to provide you with the care you need, when you need it.  We recommend signing up for the patient portal called MyChart.  Sign up information is provided on this After  Visit Summary.  MyChart is used to connect with patients for Virtual Visits (Telemedicine).  Patients are able to view lab/test results, encounter notes, upcoming appointments, etc.  Non-urgent messages can be sent to your provider as well.   To learn more about what you can  do with MyChart, go to [email or web address].    Your next appointment:   2 month(s)  The format for your next appointment:   In Person  Provider:   Lamar Fitch, MD    Other Instructions NA

## 2023-01-28 NOTE — Progress Notes (Signed)
 Cardiology Office Note:    Date:  01/28/2023   ID:  Christina Mason, DOB 1955-12-15, MRN [id number]  PCP:  Sophronia Ozell BROCKS, MD  Cardiologist:  Lamar Fitch, MD    Referring MD: Sophronia Ozell BROCKS, MD   Chief Complaint  Patient presents with   Follow-up   Shortness of Breath    History of Present Illness:    Christina Mason is a 68 y.o. female past medical history significant for permanent atrial fibrillation, obstructive sleep apnea on CPAP, dyslipidemia, essential hypertension.  Comes today to months for follow-up she is not doing well she complained of being weak tired exhausted and having shortness of breath.  She always has some shortness of breath but is getting worse.  No paroxysmal nocturnal dyspnea no swelling of lower extremities.  She does have CPAP mask for her sleep apnea.  Past Medical History:  Diagnosis Date   Abnormal weight gain 05/29/2020   Atrial fibrillation ([facility])    Colon cancer screening 05/29/2020   Diverticulosis of colon 05/29/2020   Dyslipidemia 04/17/2018   Gastroesophageal reflux disease 05/29/2020   Hiatal hernia 11/08/2013   Dr kristie daily prilosec daily  Last Assessment & Plan:  Relevant Hx: Course: Daily Update: Today's Plan:   HTN (hypertension) 05/06/2011   Hyperlipidemia    Hypertension    Internal hemorrhoids 05/29/2020   Irritable bowel syndrome 05/29/2020   Leg swelling 09/06/2018   Long term (current) use of anticoagulants 04/17/2018   Morbid (severe) obesity due to excess calories ([facility]) 03/30/2018   Morbid obesity ([facility]) 03/30/2018   Obesity    Obstructive sleep apnea 10/14/2010   NPSG 2012:  AHI 32/hr  Overview:  Overview:  NPSG 2012:  AHI 32/hr  Last Assessment & Plan:  The patient has moderate obstructive sleep apnea by her recent sleep study, and definitely has an impact on her sleep and quality of life.  I've explained to her that it also may impact her cardiovascular health, especially in light of her atrial fibrillation.  I have reviewed  the various treatment options   PONV (postoperative nausea and vomiting)    Post menopausal problems 05/06/2011   Overview:  Dr. Rosalynn OB/[facility] They have discussed risk vs benefits of HRT  Last Assessment & Plan:  Relevant Hx: Course: Daily Update: Today's Plan:   Rectal bleeding 05/29/2020   Squamous cell skin cancer 04/05/2018   Dr Joshua    Past Surgical History:  Procedure Laterality Date   CHOLECYSTECTOMY  feb 1997   COLONOSCOPY WITH PROPOFOL  N/A 11/02/2018   Procedure: COLONOSCOPY WITH PROPOFOL ;  Surgeon: Kristie Lamprey, MD;  Location: [facility];  Service: Endoscopy;  Laterality: N/A;   POLYPECTOMY  11/02/2018   Procedure: POLYPECTOMY;  Surgeon: Kristie Lamprey, MD;  Location: [facility];  Service: Endoscopy;;   TOTAL ABDOMINAL HYSTERECTOMY  feb 1997    Current Medications: Current Meds  Medication Sig   acetaminophen (TYLENOL) 650 MG CR tablet Take 1,300 mg by mouth at bedtime.   Cholecalciferol (VITAMIN D -3) 125 MCG (5000 UT) TABS Take 2,000 Units by mouth daily.   Coenzyme Q10 (COQ10) 200 MG CAPS Take 400 mg by mouth daily.   DULoxetine (CYMBALTA) 20 MG capsule Take 20 mg by mouth daily after breakfast.   DULoxetine (CYMBALTA) 60 MG capsule Take 1 capsule by mouth at bedtime.   ELIQUIS  5 MG TABS tablet TAKE 1 TABLET BY MOUTH TWICE A DAY   ezetimibe  (ZETIA ) 10 MG tablet Take 1 tablet (10 mg total) by mouth  daily.   hydrochlorothiazide  (HYDRODIURIL ) 25 MG tablet Take 1 tablet (25 mg total) by mouth daily.   metoprolol  succinate (TOPROL -XL) 100 MG 24 hr tablet TAKE 1 TABLET BY MOUTH EVERY MORNING AND 0.5 TABLETS EVERY EVENING. (Patient taking differently: Take 100 mg by mouth See admin instructions. 1 tablet in the morning and 0.5 tablet in the evening)   Multiple Vitamin (MULTIVITAMIN WITH MINERALS) TABS tablet Take 1 tablet by mouth daily. Centrum for Women 50+ Unknown strength   Omega-3 Fatty Acids (FISH OIL) 1200 MG CAPS Take 1,200 mg by mouth 2 (two) times daily.   omeprazole  (PRILOSEC) 20 MG capsule Take 20 mg by mouth daily.   simvastatin  (ZOCOR ) 40 MG tablet Take 1 tablet (40 mg total) by mouth at bedtime.   telmisartan (MICARDIS) 80 MG tablet Take 1 tablet (80 mg total) by mouth daily.   TURMERIC PO Take 2 capsules by mouth daily. 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  Tobacco Use   Smoking status: Never   Smokeless tobacco: Never  Vaping Use   Vaping status: Never Used  Substance and Sexual Activity   Alcohol use: Yes    Comment: social   Drug use: No   Sexual activity: Not on file  Other Topics Concern   Not on file  Social History Narrative   Not on file   Social Drivers of Health   Financial Resource Strain: Low Risk  (12/07/2022)   Received from [facility]   Overall Financial Resource Strain (CARDIA)    Difficulty of Paying Living Expenses: Not hard at all  Food Insecurity: No Food Insecurity (12/07/2022)   Received from [facility]   Hunger Vital Sign    Worried About Running Out of Food in the Last Year: Never true    Ran Out of Food in the Last Year: Never true  Transportation Needs: No Transportation Needs (12/07/2022)   Received from [facility] - Transportation    Lack of Transportation (Medical): No    Lack of Transportation (Non-Medical): No  Physical Activity: Unknown (12/07/2022)   Received from [facility]   Exercise Vital Sign    Days of Exercise per Week: 0 days    Minutes of Exercise per Session: Not on file  Stress: No Stress Concern Present (12/07/2022)   Received from [facility] - Occupational Stress Questionnaire    Feeling of Stress : Not at all  Social Connections: Moderately  Integrated (12/07/2022)   Received from [facility]   Social Network    How would you rate your social network (family, work, friends)?: Adequate participation with social networks     Family History: The patient's family history includes Alcoholism in her father; Asthma in her mother; CAD in her mother; Dementia in her mother; Heart disease in her mother; Hyperlipidemia in her sister; Hypertension in her brother and mother; Rheum arthritis in her mother; Stroke in her mother. ROS:   Please see the history of present illness.    All 14 point review of systems negative except as described per history of present illness  EKGs/Labs/Other Studies Reviewed:    EKG Interpretation  Date/Time:  Friday January 28 2023 09:32:58 EST Ventricular Rate:  94 PR Interval:    QRS Duration:  80 QT Interval:  372 QTC Calculation: 465 R Axis:   61  Text Interpretation: Atrial fibrillation When compared with ECG of 06-Dec-2007 12:20, Atrial fibrillation has replaced Sinus rhythm Vent. rate has increased BY  34 BPM Nonspecific T wave abnormality no longer evident in Inferior leads Nonspecific T wave abnormality no longer evident in Lateral leads Confirmed by Bernie Charleston [phone]) on 01/28/2023 9:44:37 AM    Recent Labs: 09/23/2022: ALT 17  Recent Lipid Panel    Component Value Date/Time   CHOL 139 09/23/2022 1123   TRIG 138 09/23/2022 1123   HDL 40 09/23/2022 1123   CHOLHDL 3.5 09/23/2022 1123   LDLCALC 75 09/23/2022 1123    Physical Exam:    VS:  BP 122/80 (BP Location: Right Arm, Patient Position: Sitting)   Pulse 88   Ht 5' 3 (1.6 m)   Wt 230 lb (104.3 kg)   SpO2 96%   BMI 40.74 kg/m     Wt Readings from Last 3 Encounters:  01/28/23 230 lb (104.3 kg)  02/23/22 230 lb (104.3 kg)  07/30/21 229 lb 1.3 oz (103.9 kg)     GEN:  Well nourished, well developed in no acute distress HEENT: Normal NECK: No JVD; No carotid bruits LYMPHATICS: No lymphadenopathy CARDIAC: Irregular, no  murmurs, no rubs, no gallops RESPIRATORY:  Clear to auscultation without rales, wheezing or rhonchi  ABDOMEN: Soft, non-tender, non-distended MUSCULOSKELETAL:  No edema; No deformity  SKIN: Warm and dry LOWER EXTREMITIES: no swelling NEUROLOGIC:  Alert and oriented x 3 PSYCHIATRIC:  Normal affect   ASSESSMENT:    1. Dyspnea on exertion   2. Permanent atrial fibrillation ([facility])   3. Primary hypertension   4. Obstructive sleep apnea   5. Long term (current) use of anticoagulants   6. Dyslipidemia    PLAN:    In order of problems listed above:  Permanent atrial fibrillation, rate controlled, continue anticoagulation.  I will schedule a Zio patch to make sure that the rate is adequately controlled. Dyspnea exertion multifactorial.  Will do proBNP will schedule her to have echocardiogram. Weakness fatigue tiredness I will check her pleat metabolic panel, TSH, CBC, vitamin B12 and D3. Obstructive sleep apnea uses CPAP mask.  She tells me however that after she wakes up in the morning couple minutes later she have to take another nap I am worried that she may be having some poorly controlled obstructive sleep apnea which may need to be investigated as well   Medication Adjustments/Labs and Tests Ordered: Current medicines are reviewed at length with the patient today.  Concerns regarding medicines are outlined above.  Orders Placed This Encounter  Procedures   EKG 12-Lead   Medication changes: No orders of the defined types were placed in this encounter.   Signed, [place] DOROTHA Bernie, MD, [facility] 01/28/2023 9:56 AM    [facility]

## 2023-01-29 LAB — B12 AND FOLATE PANEL
Folate: 11.3 ng/mL (ref >3.0)
Vitamin B-12: 824 pg/mL (ref 232–1245)

## 2023-01-29 LAB — COMPREHENSIVE METABOLIC PANEL
ALT: 18 [IU]/L (ref 0–32)
AST: 21 [IU]/L (ref 0–40)
Albumin: 4.7 g/dL (ref 3.9–4.9)
Alkaline Phosphatase: 87 [IU]/L (ref 44–121)
BUN/Creatinine Ratio: 18 (ref 12–28)
BUN: 15 mg/dL (ref 8–27)
Bilirubin Total: 0.6 mg/dL (ref 0.0–1.2)
CO2: 26 mmol/L (ref 20–29)
Calcium: 10.3 mg/dL (ref 8.7–10.3)
Chloride: 101 mmol/L (ref 96–106)
Creatinine, Ser: 0.82 mg/dL (ref 0.57–1.00)
Globulin, Total: 1.8 g/dL (ref 1.5–4.5)
Glucose: 104 mg/dL — ABNORMAL HIGH (ref 70–99)
Potassium: 4.4 mmol/L (ref 3.5–5.2)
Sodium: 144 mmol/L (ref 134–144)
Total Protein: 6.5 g/dL (ref 6.0–8.5)
eGFR: 78 mL/min/{1.73_m2} (ref >59)

## 2023-01-29 LAB — CBC
Hematocrit: 45.4 % (ref 34.0–46.6)
Hemoglobin: 15.1 g/dL (ref 11.1–15.9)
MCH: 32.5 pg (ref 26.6–33.0)
MCHC: 33.3 g/dL (ref 31.5–35.7)
MCV: 98 fL — ABNORMAL HIGH (ref 79–97)
Platelets: 269 10*3/uL (ref 150–450)
RBC: 4.64 x10E6/uL (ref 3.77–5.28)
RDW: 12.8 % (ref 11.7–15.4)
WBC: 7.3 10*3/uL (ref 3.4–10.8)

## 2023-01-29 LAB — TSH: TSH: 1.92 u[IU]/mL (ref 0.450–4.500)

## 2023-01-29 LAB — VITAMIN D 25 HYDROXY (VIT D DEFICIENCY, FRACTURES): Vit D, 25-Hydroxy: 58.1 ng/mL (ref 30.0–100.0)

## 2023-01-31 ENCOUNTER — Telehealth: Payer: Self-pay

## 2023-01-31 NOTE — Telephone Encounter (Signed)
 Left message on My Chart with normal lab results per Dr. Vanetta Shawl note. Routed to PCP.

## 2023-02-01 ENCOUNTER — Telehealth: Payer: Self-pay

## 2023-02-01 NOTE — Telephone Encounter (Signed)
 Pt viewed lab results on My Chart per Dr. Vanetta Shawl note. Routed to PCP.

## 2023-02-14 ENCOUNTER — Other Ambulatory Visit: Payer: Self-pay | Admitting: Cardiology

## 2023-02-14 NOTE — Telephone Encounter (Signed)
Rx refill sent to pharmacy.

## 2023-02-24 ENCOUNTER — Ambulatory Visit (HOSPITAL_BASED_OUTPATIENT_CLINIC_OR_DEPARTMENT_OTHER)
Admission: RE | Admit: 2023-02-24 | Discharge: 2023-02-24 | Disposition: A | Discharge status: Home / Self Care | Payer: Medicare Other | Source: Ambulatory Visit | Attending: Cardiology | Admitting: Cardiology

## 2023-02-24 DIAGNOSIS — R0609 Other forms of dyspnea: Secondary | ICD-10-CM

## 2023-02-24 LAB — ECHOCARDIOGRAM COMPLETE
AR max vel: 2.03 cm2
AV Area VTI: 2.03 cm2
AV Area mean vel: 1.91 cm2
AV Mean grad: 3.6 mm[Hg]
AV Peak grad: 5.7 mm[Hg]
Ao pk vel: 1.19 m/s
Area-P 1/2: 5.13 cm2
Calc EF: 66.6 %
MV M vel: 3.6 m/s
MV Peak grad: 51.8 mm[Hg]
S' Lateral: 2.3 cm
Single Plane A2C EF: 65.4 %
Single Plane A4C EF: 69.7 %

## 2023-03-01 ENCOUNTER — Telehealth: Payer: Self-pay

## 2023-03-01 NOTE — Telephone Encounter (Signed)
Left message on My Chart with normal Echo results per Dr. Vanetta Shawl note. Routed to PCP

## 2023-03-04 ENCOUNTER — Telehealth: Payer: Self-pay

## 2023-03-04 NOTE — Telephone Encounter (Signed)
LVM per DPR- per Dr. Vanetta Shawl note regarding normal Echo results. Encouraged to call with any questions. Routed to PCP.

## 2023-04-02 ENCOUNTER — Encounter: Payer: Self-pay | Admitting: Cardiology

## 2023-04-04 ENCOUNTER — Other Ambulatory Visit: Payer: Self-pay

## 2023-04-04 MED ORDER — HYDROCHLOROTHIAZIDE 25 MG PO TABS: 25.0000 mg | ORAL_TABLET | Freq: Every day | ORAL | 2 refills | Status: DC

## 2023-04-04 MED ORDER — APIXABAN 5 MG PO TABS: 5.0000 mg | ORAL_TABLET | Freq: Two times a day (BID) | ORAL | 4 refills | Status: AC

## 2023-04-04 NOTE — Telephone Encounter (Signed)
 Prescription refill request for Eliquis received. Indication:afib Last office visit:1/25 Scr:0.82  1/25 Age: 68 Weight:104.3  kg  Prescription refilled

## 2023-04-18 ENCOUNTER — Ambulatory Visit: Payer: Medicare Other | Admitting: Cardiology

## 2023-05-15 ENCOUNTER — Other Ambulatory Visit: Payer: Self-pay | Admitting: Cardiology

## 2023-05-16 ENCOUNTER — Ambulatory Visit: Admitting: Cardiology

## 2023-05-16 NOTE — Telephone Encounter (Signed)
 Rx refill sent to pharmacy.

## 2023-06-30 ENCOUNTER — Encounter: Payer: Self-pay | Admitting: Cardiology

## 2023-06-30 ENCOUNTER — Ambulatory Visit: Attending: Cardiology | Admitting: Cardiology

## 2023-06-30 VITALS — BP 132/74 | HR 90 | Ht 63.5 in | Wt 232.0 lb

## 2023-06-30 DIAGNOSIS — R0609 Other forms of dyspnea: Secondary | ICD-10-CM | POA: Insufficient documentation

## 2023-06-30 DIAGNOSIS — E785 Hyperlipidemia, unspecified: Secondary | ICD-10-CM | POA: Diagnosis present

## 2023-06-30 DIAGNOSIS — I10 Essential (primary) hypertension: Secondary | ICD-10-CM | POA: Insufficient documentation

## 2023-06-30 DIAGNOSIS — I4821 Permanent atrial fibrillation: Secondary | ICD-10-CM | POA: Insufficient documentation

## 2023-06-30 NOTE — Patient Instructions (Addendum)
 Medication Instructions:  Your physician recommends that you continue on your current medications as directed. Please refer to the Current Medication list given to you today.  *If you need a refill on your cardiac medications before your next appointment, please call your pharmacy*   Lab Work: None Ordered If you have labs (blood work) drawn today and your tests are completely normal, you will receive your results only by: MyChart Message (if you have MyChart) OR A paper copy in the mail If you have any lab test that is abnormal or we need to change your treatment, we will call you to review the results.   Testing/Procedures: None Ordered   Follow-Up: At [facility], you and your health needs are our priority.  As part of our continuing mission to provide you with exceptional heart care, we have created designated Provider Care Teams.  These Care Teams include your primary Cardiologist (physician) and Advanced Practice Providers (APPs -  Physician Assistants and Nurse Practitioners) who all work together to provide you with the care you need, when you need it.  We recommend signing up for the patient portal called MyChart.  Sign up information is provided on this After Visit Summary.  MyChart is used to connect with patients for Virtual Visits (Telemedicine).  Patients are able to view lab/test results, encounter notes, upcoming appointments, etc.  Non-urgent messages can be sent to your provider as well.   To learn more about what you can do with MyChart, go to [email or web address].    Your next appointment:   6 month(s)  The format for your next appointment:   In Person  Provider:   Ralene Burger, MD    Other Instructions Referral to Pulmonary- they will call for appt

## 2023-06-30 NOTE — Progress Notes (Signed)
 Cardiology Office Note:    Date:  06/30/2023   ID:  Christina Mason, DOB [birth year], MRN [id number]  PCP:  Emaline Handsome, MD  Cardiologist:  Ralene Burger, MD    Referring MD: Emaline Handsome, MD   Chief Complaint  Patient presents with   Follow-up    History of Present Illness:    Christina Mason is a 68 y.o. female past medical history significant for permanent atrial fibrillation, obstructive sleep apnea on CPAP mask, dyslipidemia, essential hypertension, obesity.  Comes today 2 months for follow-up.  Last time of seeing him she was complaining of weakness fatigue tiredness and shortness of breath.  Cardiac workup negative echocardiogram showed preserved ejection fraction, biochemical markers were normal.  And she did wear Zio patch relatively well-controlled ventricular rate of atrial fibrillation.  She said overall she is doing a little bit better but shortness of breath and fatigue is still there.  She requested a referral to pulmonary which I think is a good idea  Past Medical History:  Diagnosis Date   Abnormal weight gain 05/29/2020   Atrial fibrillation (HCC)    Colon cancer screening 05/29/2020   Diverticulosis of colon 05/29/2020   Dyslipidemia 04/17/2018   Gastroesophageal reflux disease 05/29/2020   Hiatal hernia 11/08/2013   Dr Tova Fresh daily prilosec daily  Last Assessment & Plan:  Relevant Hx: Course: Daily Update: Today's Plan:   HTN (hypertension) 05/06/2011   Hyperlipidemia    Hypertension    Internal hemorrhoids 05/29/2020   Irritable bowel syndrome 05/29/2020   Leg swelling 09/06/2018   Long term (current) use of anticoagulants 04/17/2018   Morbid (severe) obesity due to excess calories (HCC) 03/30/2018   Morbid obesity (HCC) 03/30/2018   Obesity    Obstructive sleep apnea 10/14/2010   NPSG 2012:  AHI 32/hr  Overview:  Overview:  NPSG 2012:  AHI 32/hr  Last Assessment & Plan:  The patient has moderate obstructive sleep apnea by her recent sleep study, and  definitely has an impact on her sleep and quality of life.  I've explained to her that it also may impact her cardiovascular health, especially in light of her atrial fibrillation.  I have reviewed the various treatment options   PONV (postoperative nausea and vomiting)    Post menopausal problems 05/06/2011   Overview:  Dr. [facility] They have discussed risk vs benefits of HRT  Last Assessment & Plan:  Relevant Hx: Course: Daily Update: Today's Plan:   Rectal bleeding 05/29/2020   Squamous cell skin cancer 04/05/2018   Dr Rochelle Chu    Past Surgical History:  Procedure Laterality Date   CHOLECYSTECTOMY  feb 1997   COLONOSCOPY WITH PROPOFOL  N/A 11/02/2018   Procedure: COLONOSCOPY WITH PROPOFOL ;  Surgeon: Tami Falcon, MD;  Location: [facility];  Service: Endoscopy;  Laterality: N/A;   POLYPECTOMY  11/02/2018   Procedure: POLYPECTOMY;  Surgeon: Tami Falcon, MD;  Location: [facility];  Service: Endoscopy;;   TOTAL ABDOMINAL HYSTERECTOMY  feb 1997    Current Medications: Current Meds  Medication Sig   acetaminophen (TYLENOL) 650 MG CR tablet Take 1,300 mg by mouth at bedtime.   apixaban  (ELIQUIS ) 5 MG TABS tablet Take 1 tablet (5 mg total) by mouth 2 (two) times daily.   Cholecalciferol (VITAMIN D -3) 125 MCG (5000 UT) TABS Take 2,000 Units by mouth daily.   Coenzyme Q10 (COQ10) 200 MG CAPS Take 400 mg by mouth daily.   DULoxetine (CYMBALTA) 20 MG capsule Take 20 mg by mouth daily  after breakfast.   DULoxetine (CYMBALTA) 60 MG capsule Take 1 capsule by mouth at bedtime.   ezetimibe  (ZETIA ) 10 MG tablet Take 1 tablet (10 mg total) by mouth daily.   hydrochlorothiazide  (HYDRODIURIL ) 25 MG tablet Take 1 tablet (25 mg total) by mouth daily.   metoprolol  succinate (TOPROL -XL) 100 MG 24 hr tablet Take 1 tablet (100 mg total) by mouth as directed. Take 1 tablet every morning and 1/2 tablet in every evening   Multiple Vitamin (MULTIVITAMIN WITH MINERALS) TABS tablet Take 1 tablet by mouth daily.  Centrum for Women 50+ Unknown strength   Omega-3 Fatty Acids (FISH OIL) 1200 MG CAPS Take 1,200 mg by mouth 2 (two) times daily.   omeprazole (PRILOSEC) 20 MG capsule Take 20 mg by mouth daily.   simvastatin  (ZOCOR ) 40 MG tablet Take 1 tablet (40 mg total) by mouth daily at 6 PM.   telmisartan (MICARDIS) 80 MG tablet TAKE 1 TABLET BY MOUTH EVERY DAY   TURMERIC PO Take 2 capsules by mouth daily. 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  Tobacco Use   Smoking status: Never   Smokeless tobacco: Never  Vaping Use   Vaping status: Never Used  Substance and Sexual Activity   Alcohol use: Yes    Comment: social   Drug use: No   Sexual activity: Not on file  Other Topics Concern   Not on file  Social History Narrative   Not on file   Social Drivers of Health   Financial Resource Strain: Low Risk  (06/06/2023)   Received from [facility]   Overall Financial Resource Strain (CARDIA)    Difficulty of Paying Living Expenses: Not hard at all  Food Insecurity: No Food Insecurity (06/06/2023)   [facility] from [facility]   Hunger Vital Sign    Worried About Running Out of Food in the Last Year: Never true    Ran Out of Food in the Last Year: Never true  Transportation Needs: No Transportation Needs (06/06/2023)   Received from [facility] - Transportation    Lack of Transportation (Medical): No    Lack of Transportation (Non-Medical): No  Physical Activity: Unknown (06/06/2023)   Received from [facility]   Exercise Vital Sign    Days of Exercise per Week: 0 days    Minutes of Exercise per Session: Not on file  Stress: Stress Concern Present (06/06/2023)   Received from [facility]  of Occupational Health - Occupational Stress Questionnaire    Feeling of Stress : To some extent  Social Connections: Socially Integrated (06/06/2023)   Received from [facility]   Social Network    How would you rate your social network (family, work, friends)?: Good participation with social networks     Family History: The patient's family history includes Alcoholism in her father; Asthma in her mother; CAD in her mother; Dementia in her mother; Heart disease in her mother; Hyperlipidemia in her sister; Hypertension in her brother and mother; Rheum arthritis in her mother; Stroke in her mother. ROS:   Please see the history of present illness.    All 14 point review of systems negative  except as described per history of present illness  EKGs/Labs/Other Studies Reviewed:         Recent Labs: 01/28/2023: ALT 18; BUN 15; Creatinine, Ser 0.82; Hemoglobin 15.1; Platelets 269; Potassium 4.4; Sodium 144; TSH 1.920  Recent Lipid Panel    Component Value Date/Time   CHOL 139 09/23/2022 1123   TRIG 138 09/23/2022 1123   HDL 40 09/23/2022 1123   CHOLHDL 3.5 09/23/2022 1123   LDLCALC 75 09/23/2022 1123    Physical Exam:    VS:  BP 132/74 (BP Location: Right Arm, Patient Position: Sitting)   Pulse 90   Ht 5' 3.5 (1.613 m)   Wt 232 lb (105.2 kg)   SpO2 90%   BMI 40.45 kg/m     Wt Readings from Last 3 Encounters:  06/30/23 232 lb (105.2 kg)  01/28/23 230 lb (104.3 kg)  02/23/22 230 lb (104.3 kg)     GEN:  Well nourished, well developed in no acute distress HEENT: Normal NECK: No JVD; No carotid bruits LYMPHATICS: No lymphadenopathy CARDIAC: Irregularly irregular, no murmurs, no rubs, no gallops RESPIRATORY:  Clear to auscultation without rales, wheezing or rhonchi  ABDOMEN: Soft, non-tender, non-distended MUSCULOSKELETAL:  No edema; No deformity  SKIN: Warm and dry LOWER EXTREMITIES: no swelling NEUROLOGIC:  Alert and oriented x 3 PSYCHIATRIC:  Normal affect    ASSESSMENT:    1. Dyspnea on exertion   2. Permanent atrial fibrillation (HCC)   3. Primary hypertension   4. Dyslipidemia    PLAN:    In order of problems listed above:  Dyspnea on exertion multifactorial obesity play some role here but she does have obstructive sleep apnea and she wants to be seen by pulmonologist we will make arrangements for consultation. Permanent atrial fibrillation rate control anticoagulated with Eliquis  5 mg twice daily dose is appropriate to her weight age and kidney function. Essential hypertension blood pressure well-controlled continue present management. Dyslipidemia I did review K PN which show me LDL 75 HDL 40 continue present management   Medication Adjustments/Labs and Tests Ordered: Current medicines are reviewed at length with the patient today.  Concerns regarding medicines are outlined above.  Orders Placed This Encounter  Procedures   Ambulatory referral to Pulmonology   Medication changes: No orders of the defined types were placed in this encounter.   Signed, Manfred Seed, MD, [facility] 06/30/2023 9:56 AM     [facility]

## 2023-08-10 ENCOUNTER — Other Ambulatory Visit: Payer: Self-pay | Admitting: Cardiology

## 2023-08-21 NOTE — Progress Notes (Unsigned)
 Christina Mason, female    DOB: 1955/11/11   MRN: [id number]   Brief patient profile:  47 yowf never smoker with pos smoking both parents and pna age 68  referred to pulmonary clinic 08/24/2023 by Christina Christina Mason  for doe  x 2020    AFib 2010 > Christina Mason at wt 190  Saw PCCM  = Christina Mason 2012 for OSA  225  Echo was done  02/24/23 mild MR     History of Present Illness  08/24/2023  Pulmonary/ 1st office eval/Christina Mason at wt  237  Chief Complaint  Patient presents with   Consult    SOB with exertion. Worse when going up hill or walking quickly. No SOB at rest.   Dyspnea:  MMRC2 = can't walk a nl pace on a flat grade s sob but does fine slow and flat   Shopping / parking s [facility] but can't tol hills Cough: none  Sleep: flat bed 2 pillows and cpap SABA use: none  02 ldz:wnwz  No obvious day to day or daytime pattern/variability or assoc excess/ purulent sputum or mucus plugs or hemoptysis or cp or chest tightness, subjective wheeze or overt sinus or hb symptoms.    Also denies any obvious fluctuation of symptoms with weather or environmental changes or other aggravating or alleviating factors except as outlined above   No unusual exposure hx or h/o childhood asthma/knowledge of premature birth.  Current Allergies, Complete Past Medical History, Past Surgical History, Family History, and Social History were reviewed in Christina Mason record.  ROS  The following are not active complaints unless bolded Hoarseness, sore throat, dysphagia, dental problems, itching, sneezing,  nasal congestion or discharge of excess mucus or purulent secretions, ear ache,   fever, chills, sweats, unintended wt loss or wt gain, classically pleuritic or exertional cp,  orthopnea pnd or arm/hand swelling  or leg swelling R leg  habits or change in bladder habits, change in stools or change in urine, dysuria, hematuria,  rash R leg chronic, arthralgias, visual complaints, headache, numbness, weakness or ataxia or  problems with walking or coordination,  change in mood or  memory.             Outpatient Medications Prior to Visit  Medication Sig Dispense Refill   acetaminophen (TYLENOL) 650 MG CR tablet Take 1,300 mg by mouth at bedtime.     apixaban  (ELIQUIS ) 5 MG TABS tablet Take 1 tablet (5 mg total) by mouth 2 (two) times Mason. 180 tablet 4   Cholecalciferol (VITAMIN D -3) 125 MCG (5000 UT) TABS Take 2,000 Units by mouth Mason.     Coenzyme Q10 (COQ10) 200 MG CAPS Take 400 mg by mouth Mason.     DULoxetine (CYMBALTA) 20 MG capsule Take 20 mg by mouth Mason after breakfast.     DULoxetine (CYMBALTA) 60 MG capsule Take 1 capsule by mouth at bedtime.     ezetimibe  (ZETIA ) 10 MG tablet TAKE 1 TABLET BY MOUTH EVERY DAY 90 tablet 3   hydrochlorothiazide  (HYDRODIURIL ) 25 MG tablet Take 1 tablet (25 mg total) by mouth Mason. 90 tablet 2   metoprolol  succinate (TOPROL -XL) 100 MG 24 hr tablet Take 1 tablet (100 mg total) by mouth as directed. Take 1 tablet every morning and 1/2 tablet in every evening 135 tablet 1   Multiple Vitamin (MULTIVITAMIN WITH MINERALS) TABS tablet Take 1 tablet by mouth Mason. Centrum for Women 50+ Unknown strength     Omega-3 Fatty Acids (FISH OIL) 1200  MG CAPS Take 1,200 mg by mouth 2 (two) times Mason.     omeprazole (PRILOSEC) 20 MG capsule Take 20 mg by mouth Mason.     simvastatin  (ZOCOR ) 40 MG tablet Take 1 tablet (40 mg total) by mouth Mason at 6 PM. 90 tablet 3   telmisartan (MICARDIS) 80 MG tablet TAKE 1 TABLET BY MOUTH EVERY DAY 90 tablet 3   TURMERIC PO Take 2 capsules by mouth Mason. Unknown strength     No facility-administered medications prior to visit.    Past Medical History:  Diagnosis Date   Abnormal weight gain 05/29/2020   Atrial fibrillation (HCC)    Colon cancer screening 05/29/2020   Diverticulosis of colon 05/29/2020   Dyslipidemia 04/17/2018   Gastroesophageal reflux disease 05/29/2020   Hiatal hernia 11/08/2013   Christina Mason  Last  Assessment & Plan:  Relevant Hx: Course: Mason Update: Today's Plan:   HTN (hypertension) 05/06/2011   Hyperlipidemia    Hypertension    Internal hemorrhoids 05/29/2020   Irritable bowel syndrome 05/29/2020   Leg swelling 09/06/2018   Long term (current) use of anticoagulants 04/17/2018   Morbid (severe) obesity due to excess calories (HCC) 03/30/2018   Morbid obesity (HCC) 03/30/2018   Obesity    Obstructive sleep apnea 10/14/2010   NPSG 2012:  AHI 32/hr  Overview:  Overview:  NPSG 2012:  AHI 32/hr  Last Assessment & Plan:  The patient has moderate obstructive sleep apnea by her recent sleep study, and definitely has an impact on her sleep and quality of life.  I've explained to her that it also may impact her cardiovascular health, especially in light of her atrial fibrillation.  I have reviewed the various treatment options   PONV (postoperative nausea and vomiting)    Post menopausal problems 05/06/2011   Overview:  [facility] They have discussed risk vs benefits of HRT  Last Assessment & Plan:  Relevant Hx: Course: Mason Update: Today's Plan:   Rectal bleeding 05/29/2020   Squamous cell skin cancer 04/05/2018   Christina Mason      Objective:     BP 112/62   Pulse 98   Temp 98.1 F (36.7 C)   Ht 5' 3.5 (1.613 m)   Wt 237 lb 9.6 oz (107.8 kg)   SpO2 98% Comment: RA  BMI 41.43 kg/m   SpO2: 98 % (RA) very pleasant (by BMI) amb wf nad   HEENT : Oropharynx  clear      Nasal turbinates nl    NECK :  without  apparent JVD/ palpable Nodes/TM    LUNGS: no acc muscle use,  Nl contour chest which is clear to A and P bilaterally without cough on insp or exp maneuvers   CV:  IRIR  no s3 or  1-2/6 HS  s increase in P2, and R>LLE  mild pitting edema   JAI:nazdz  soft and nontender   MS:  Gait nl   ext warm without deformities Or obvious joint restrictions  calf tenderness, cyanosis or clubbing    SKIN: warm and dry with chronic venous stasis hyperpigmentation  R>> L LE   NEURO:   alert, approp, nl sensorium with  no motor or cerebellar deficits apparent.    CXR PA and Lateral:   08/24/2023 :    I personally reviewed images and impression is as follows:  Increase markings over RLL ? Breast tissue as not seen on lateral view.  Assessment   Assessment & Plan DOE Onset 2019  indolent and minimally progressive to  [facility] 2 at ov 08/24/2023  - CAF since 2010/ wt 190 and no doe/ failed cardioversion/ maint on eliquis   - Echo 02/24/23  with afib/ nl lv but  trace to mild MR and Mild LAE  - 08/24/2023  @ 237 lb  Walked on RA  x  3  lap(s) =  approx 750  ft  @ fast  pace,  with lowest 02 sats 90% but finished at 93%    Symptoms are  disproportionate to objective findings and not clear to what extent this is actually a pulmonary  problem but pt does appear to have difficult to sort out respiratory symptoms of unknown origin for which  DDX  = almost all start with A and  include Adherence, Ace Inhibitors, Acid Reflux, Active Sinus Disease, Alpha 1 Antitripsin deficiency, Anxiety/depression / deconditioning with assoc wt loss masquerading as Airways dz,  ABPA,  Allergy(esp in young), Aspiration (esp in elderly), Adverse effects of meds,  Active smoking or Vaping, A bunch of PE's/clot burden (a few small clots can't cause this syndrome unless there is already severe underlying pulm or vascular dz with poor reserve),  Anemia or thyroid  disorder, plus two Bs  = Bronchiectasis and Beta blocker use..and one C= CHF    >>>>Will do DOE labs and continue gerd rx and have her return for pfts when available.  >>>> Note the AFib is assoc with mild MR/ LAE so needs to keep bp and pulse under good control and start doing more consitent ex eg water aerobics or recumbent bike might be well tolerated   Morbid obesity due to excess calories (HCC) Body mass index is 41.43 kg/m.   Lab Results  Component Value Date   TSH 1.920 01/28/2023    Contributing to doe and risk of GERD >>>   reviewed the  need and the process to achieve and maintain neg calorie balance > defer f/u primary care including intermittently monitoring thyroid  status       Each maintenance medication was reviewed in detail including emphasizing most importantly the difference between maintenance and prns and under what circumstances the prns are to be triggered using an action plan format where appropriate.  Total time for H and P, chart review, counseling,  directly observing portions of ambulatory 02 saturation study/ and generating customized AVS unique to this office visit / same day charting = 60 min new pt eval for chronic refractory slowly  respiratory  symptoms of uncertain etiology.                Ozell America, MD 08/24/2023

## 2023-08-24 ENCOUNTER — Ambulatory Visit

## 2023-08-24 ENCOUNTER — Encounter: Payer: Self-pay | Admitting: Internal Medicine

## 2023-08-24 ENCOUNTER — Ambulatory Visit (INDEPENDENT_AMBULATORY_CARE_PROVIDER_SITE_OTHER): Admitting: Internal Medicine

## 2023-08-24 VITALS — BP 112/62 | HR 98 | Temp 98.1°F | Ht 63.5 in | Wt 237.6 lb

## 2023-08-24 DIAGNOSIS — R0609 Other forms of dyspnea: Secondary | ICD-10-CM

## 2023-08-24 DIAGNOSIS — Z6841 Body Mass Index (BMI) 40.0 and over, adult: Secondary | ICD-10-CM | POA: Diagnosis not present

## 2023-08-24 NOTE — Assessment & Plan Note (Addendum)
 Onset 2019  indolent and minimally progressive to  [facility] 2 at ov 08/24/2023  - CAF since 2010/ wt 190 and no doe/ failed cardioversion/ maint on eliquis   - Echo 02/24/23  with afib/ nl lv but  trace to mild MR and Mild LAE  - 08/24/2023  @ 237 lb  Walked on RA  x  3  lap(s) =  approx 750  ft  @ fast  pace,  with lowest 02 sats 90% but finished at 93%    Symptoms are  disproportionate to objective findings and not clear to what extent this is actually a pulmonary  problem but pt does appear to have difficult to sort out respiratory symptoms of unknown origin for which  DDX  = almost all start with A and  include Adherence, Ace Inhibitors, Acid Reflux, Active Sinus Disease, Alpha 1 Antitripsin deficiency, Anxiety/depression / deconditioning with assoc wt loss masquerading as Airways dz,  ABPA,  Allergy(esp in young), Aspiration (esp in elderly), Adverse effects of meds,  Active smoking or Vaping, A bunch of PE's/clot burden (a few small clots can't cause this syndrome unless there is already severe underlying pulm or vascular dz with poor reserve),  Anemia or thyroid  disorder, plus two Bs  = Bronchiectasis and Beta blocker use..and one C= CHF    >>>>Will do DOE labs and continue gerd rx and have her return for pfts when available.  >>>> Note the AFib is assoc with mild MR/ LAE so needs to keep bp and pulse under good control and start doing more consitent ex eg water aerobics or recumbent bike might be well tolerated

## 2023-08-24 NOTE — Assessment & Plan Note (Addendum)
 Body mass index is 41.43 kg/m.   Lab Results  Component Value Date   TSH 1.920 01/28/2023    Contributing to doe and risk of GERD >>>   reviewed the need and the process to achieve and maintain neg calorie balance > defer f/u primary care including intermittently monitoring thyroid  status       Each maintenance medication was reviewed in detail including emphasizing most importantly the difference between maintenance and prns and under what circumstances the prns are to be triggered using an action plan format where appropriate.  Total time for H and P, chart review, counseling,  directly observing portions of ambulatory 02 saturation study/ and generating customized AVS unique to this office visit / same day charting = 60 min new pt eval for chronic refractory slowly  respiratory  symptoms of uncertain etiology.

## 2023-08-24 NOTE — Patient Instructions (Addendum)
 To get the most out of exercise, you need to be continuously aware that you are short of breath, but never out of breath, for at least 30 minutes daily. As you improve, it will actually be easier for you to do the same amount of exercise  in  30 minutes so always push to the level where you are short of breath.     My office will be contacting you by phone for referral PFTs next available - if you don't hear back from my office within one week please call us  back or notify us  thru MyChart and we'll address it right away.   Please remember to go to the  x-ray department  for your tests - we will call you with the results when they are available    Please remember to go to the lab department   for your tests - we will call you with the results when they are available.  I will call you with the results  when available to determine whether follow up is needed in this clinic

## 2023-08-25 ENCOUNTER — Ambulatory Visit: Payer: Self-pay | Admitting: Internal Medicine

## 2023-08-25 LAB — CBC WITH DIFFERENTIAL/PLATELET
Basophils Absolute: 0.1 K/uL (ref 0.0–0.1)
Basophils Relative: 1.4 % (ref 0.0–3.0)
Eosinophils Absolute: 0.2 K/uL (ref 0.0–0.7)
Eosinophils Relative: 2.6 % (ref 0.0–5.0)
HCT: 43.1 % (ref 36.0–46.0)
Hemoglobin: 14.8 g/dL (ref 12.0–15.0)
Lymphocytes Relative: 34.4 % (ref 12.0–46.0)
Lymphs Abs: 2.5 K/uL (ref 0.7–4.0)
MCHC: 34.2 g/dL (ref 30.0–36.0)
MCV: 96 fl (ref 78.0–100.0)
Monocytes Absolute: 0.6 K/uL (ref 0.1–1.0)
Monocytes Relative: 8.1 % (ref 3.0–12.0)
Neutro Abs: 3.9 K/uL (ref 1.4–7.7)
Neutrophils Relative %: 53.5 % (ref 43.0–77.0)
Platelets: 265 K/uL (ref 150.0–400.0)
RBC: 4.49 Mil/uL (ref 3.87–5.11)
RDW: 13.4 % (ref 11.5–15.5)
WBC: 7.3 K/uL (ref 4.0–10.5)

## 2023-08-25 LAB — BASIC METABOLIC PANEL WITH GFR
BUN: 14 mg/dL (ref 6–23)
CO2: 24 meq/L (ref 19–32)
Calcium: 9.8 mg/dL (ref 8.4–10.5)
Chloride: 103 meq/L (ref 96–112)
Creatinine, Ser: 0.77 mg/dL (ref 0.40–1.20)
GFR: 79.13 mL/min (ref >60.00)
Glucose, Bld: 97 mg/dL (ref 70–99)
Potassium: 3.9 meq/L (ref 3.5–5.1)
Sodium: 143 meq/L (ref 135–145)

## 2023-08-25 LAB — BRAIN NATRIURETIC PEPTIDE: Pro B Natriuretic peptide (BNP): 108 pg/mL — ABNORMAL HIGH (ref 0.0–100.0)

## 2023-08-25 LAB — TSH: TSH: 1.29 u[IU]/mL (ref 0.35–5.50)

## 2023-08-29 ENCOUNTER — Ambulatory Visit: Admitting: Internal Medicine

## 2023-08-29 ENCOUNTER — Telehealth: Payer: Self-pay | Admitting: Internal Medicine

## 2023-08-29 DIAGNOSIS — R0609 Other forms of dyspnea: Secondary | ICD-10-CM

## 2023-08-29 LAB — PULMONARY FUNCTION TEST
DL/VA % pred: 104 %
DL/VA: 4.37 ml/min/mmHg/L
DLCO cor % pred: 86 %
DLCO cor: 16.75 ml/min/mmHg
DLCO unc % pred: 90 %
DLCO unc: 17.43 ml/min/mmHg
FEF 25-75 Post: 2.38 L/s
FEF 25-75 Pre: 1.64 L/s
FEF2575-%Change-Post: 45 %
FEF2575-%Pred-Post: 121 %
FEF2575-%Pred-Pre: 83 %
FEV1-%Change-Post: 8 %
FEV1-%Pred-Post: 90 %
FEV1-%Pred-Pre: 83 %
FEV1-Post: 2.07 L
FEV1-Pre: 1.9 L
FEV1FVC-%Change-Post: 1 %
FEV1FVC-%Pred-Pre: 102 %
FEV6-%Change-Post: 7 %
FEV6-%Pred-Post: 90 %
FEV6-%Pred-Pre: 84 %
FEV6-Post: 2.6 L
FEV6-Pre: 2.42 L
FEV6FVC-%Change-Post: 0 %
FEV6FVC-%Pred-Post: 104 %
FEV6FVC-%Pred-Pre: 104 %
FVC-%Change-Post: 7 %
FVC-%Pred-Post: 86 %
FVC-%Pred-Pre: 80 %
FVC-Post: 2.6 L
FVC-Pre: 2.43 L
Post FEV1/FVC ratio: 80 %
Post FEV6/FVC ratio: 100 %
Pre FEV1/FVC ratio: 78 %
Pre FEV6/FVC Ratio: 100 %
RV % pred: 89 %
RV: 1.89 L
TLC % pred: 91 %
TLC: 4.54 L

## 2023-08-29 NOTE — Telephone Encounter (Signed)
 PT just completed a PFT test. She is not sure f she needs to return for a FU. Please call her with the test results and advise at that point. TY.

## 2023-08-29 NOTE — Telephone Encounter (Signed)
 Dr Darlean,   Can you and your nurse advise PFT results.

## 2023-08-29 NOTE — Telephone Encounter (Signed)
 Will forward to the nurses to get results

## 2023-08-29 NOTE — Progress Notes (Signed)
 Full pft performed today

## 2023-08-29 NOTE — Patient Instructions (Signed)
 Full pft performed today

## 2023-08-30 NOTE — Progress Notes (Signed)
 Atc x1 the phone call was answered and there was a very loud noise on the other end then the call hung up, Will try again.

## 2023-08-30 NOTE — Telephone Encounter (Signed)
 Study shows mild effects of weight on lung vol but nothing else and no need for f/u for this but can see her back prn.   ATC X2. Unable to lvm as mb is full. I will send pt a mychart message then I am completing note per protocol. NFN

## 2023-09-02 NOTE — Telephone Encounter (Signed)
 Atc x2 mail box is full - closing enc - sending letter

## 2023-11-11 ENCOUNTER — Other Ambulatory Visit: Payer: Self-pay | Admitting: Cardiology

## 2023-12-17 ENCOUNTER — Other Ambulatory Visit: Payer: Self-pay | Admitting: Cardiology

## 2024-02-09 ENCOUNTER — Telehealth: Payer: Self-pay | Admitting: *Deleted

## 2024-02-09 ENCOUNTER — Other Ambulatory Visit: Payer: Self-pay | Admitting: Cardiology

## 2024-02-09 ENCOUNTER — Other Ambulatory Visit: Payer: Self-pay | Admitting: Gastroenterology

## 2024-02-09 NOTE — Telephone Encounter (Signed)
"  ° °  Pre-operative Risk Assessment    Patient Name: Christina Mason  DOB: 09/30/1955 MRN: [id number]      Request for Surgical Clearance    Procedure:  Colonoscopy  Date of Surgery:  Clearance 03/02/24                                 Surgeon:  Dr. Rollin Socks Group or Practice Name:  [facility], [place] Phone number:  [phone] Fax number:  [phone]   Type of Clearance Requested:   - Medical  - Pharmacy:  Hold Apixaban  (Eliquis ) Please advise   Type of Anesthesia:  Propofol    Additional requests/questions:    Bonney Arloa Donovan Levorn   02/09/2024, 4:57 PM   "

## 2024-02-13 NOTE — Telephone Encounter (Signed)
 Patient with diagnosis of atrial fibrillation on Eliquis  for anticoagulation.    Procedure:  Colonoscopy   Date of Surgery:  Clearance 03/02/24   CHA2DS2-VASc Score = 3   This indicates a 3.2% annual risk of stroke. The patient's score is based upon: CHF History: 0 HTN History: 1 Diabetes History: 0 Stroke History: 0 Vascular Disease History: 0 Age Score: 1 Gender Score: 1    CrCl 82 ml/min Platelet count 265 K  Patient has not had an Afib/aflutter ablation in the last 3 months, DCCV within the last 4 weeks or a watchman implanted in the last 45 days   Per office protocol, patient can hold Eliquis  for 2 days prior to procedure.    Patient will not need bridging with Lovenox (enoxaparin) around procedure.  **This guidance is not considered finalized until pre-operative APP has relayed final recommendations.**

## 2024-02-14 NOTE — Telephone Encounter (Signed)
" ° °  Name: Christina Mason  DOB: [birth year]  MRN: [id number]  Primary Cardiologist: None   Preoperative team, please contact this patient and set up a phone call appointment for further preoperative risk assessment. Please obtain consent and complete medication review. Thank you for your help.  I confirm that guidance regarding antiplatelet and oral anticoagulation therapy has been completed and, if necessary, noted below. - Per office protocol, patient can hold Eliquis  for 2 days prior to procedure. Patient will not need bridging with Lovenox (enoxaparin) around procedure.  I also confirmed the patient resides in the state of [place] . As per [facility] [employer] telemedicine laws, the patient must reside in the state in which the provider is licensed.   Darrielle Pflieger E Niyonna Betsill, PA-C 02/14/2024, 9:16 PM [facility]    "

## 2024-02-15 ENCOUNTER — Telehealth (HOSPITAL_BASED_OUTPATIENT_CLINIC_OR_DEPARTMENT_OTHER): Payer: Self-pay | Admitting: *Deleted

## 2024-02-15 NOTE — Telephone Encounter (Signed)
 Pt has been scheduled tele preop appt 02/21/24. Med rec and consent are done.

## 2024-02-15 NOTE — Telephone Encounter (Signed)
 I called to s/w the pt though pt's husband answered. He said she was not available right this moment and if I could call back in 10 minutes so I could s/w her directly. I said that will be fine and we will call back later today.

## 2024-02-15 NOTE — Telephone Encounter (Signed)
 Pt has been scheduled tele preop appt 02/21/24. Med rec and consent are done.      Patient Consent for Virtual Visit        Christina Mason has provided verbal consent on 02/15/2024 for a virtual visit (video or telephone).   CONSENT FOR VIRTUAL VISIT FOR:  Christina Mason  By participating in this virtual visit I agree to the following:  I hereby voluntarily request, consent and authorize [facility] and its employed or contracted physicians, physician assistants, nurse practitioners or other licensed health care professionals (the Practitioner), to provide me with telemedicine health care services (the Services) as deemed necessary by the treating Practitioner. I acknowledge and consent to receive the Services by the Practitioner via telemedicine. I understand that the telemedicine visit will involve communicating with the Practitioner through live audiovisual communication technology and the disclosure of certain medical information by electronic transmission. I acknowledge that I have been given the opportunity to request an in-person assessment or other available alternative prior to the telemedicine visit and am voluntarily participating in the telemedicine visit.  I understand that I have the right to withhold or withdraw my consent to the use of telemedicine in the course of my care at any time, without affecting my right to future care or treatment, and that the Practitioner or I may terminate the telemedicine visit at any time. I understand that I have the right to inspect all information obtained and/or recorded in the course of the telemedicine visit and may receive copies of available information for a reasonable fee.  I understand that some of the potential risks of receiving the Services via telemedicine include:  Delay or interruption in medical evaluation due to technological equipment failure or disruption; Information transmitted may not be sufficient (e.g. poor  resolution of images) to allow for appropriate medical decision making by the Practitioner; and/or  In rare instances, security protocols could fail, causing a breach of personal health information.  Furthermore, I acknowledge that it is my responsibility to provide information about my medical history, conditions and care that is complete and accurate to the best of my ability. I acknowledge that Practitioner's advice, recommendations, and/or decision may be based on factors not within their control, such as incomplete or inaccurate data provided by me or distortions of diagnostic images or specimens that may result from electronic transmissions. I understand that the practice of medicine is not an exact science and that Practitioner makes no warranties or guarantees regarding treatment outcomes. I acknowledge that a copy of this consent can be made available to me via my patient portal [facility] MyChart), or I can request a printed copy by calling the office of [facility].    I understand that my insurance will be billed for this visit.   I have read or had this consent read to me. I understand the contents of this consent, which adequately explains the benefits and risks of the Services being provided via telemedicine.  I have been provided ample opportunity to ask questions regarding this consent and the Services and have had my questions answered to my satisfaction. I give my informed consent for the services to be provided through the use of telemedicine in my medical care

## 2024-02-20 ENCOUNTER — Encounter (HOSPITAL_COMMUNITY): Payer: Self-pay | Admitting: Gastroenterology

## 2024-02-21 ENCOUNTER — Ambulatory Visit

## 2024-02-21 DIAGNOSIS — Z01810 Encounter for preprocedural cardiovascular examination: Secondary | ICD-10-CM

## 2024-02-21 DIAGNOSIS — Z01818 Encounter for other preprocedural examination: Secondary | ICD-10-CM

## 2024-03-02 ENCOUNTER — Encounter (HOSPITAL_COMMUNITY): Payer: Self-pay

## 2024-03-02 ENCOUNTER — Ambulatory Visit (HOSPITAL_COMMUNITY): Admit: 2024-03-02 | Admitting: Gastroenterology

## 2024-03-02 SURGERY — COLONOSCOPY
Anesthesia: Monitor Anesthesia Care
